# Patient Record
Sex: Male | Born: 1960 | Race: White | Hispanic: No | Marital: Married | Smoking: Never smoker
Health system: Southern US, Community
[De-identification: ages and names within clinical notes are randomized; demographics above are authoritative.]

## PROBLEM LIST (undated history)

## (undated) DIAGNOSIS — Z955 Presence of coronary angioplasty implant and graft: Secondary | ICD-10-CM

## (undated) DIAGNOSIS — M47819 Spondylosis without myelopathy or radiculopathy, site unspecified: Secondary | ICD-10-CM

## (undated) DIAGNOSIS — N528 Other male erectile dysfunction: Secondary | ICD-10-CM

## (undated) DIAGNOSIS — R0781 Pleurodynia: Secondary | ICD-10-CM

## (undated) DIAGNOSIS — E785 Hyperlipidemia, unspecified: Secondary | ICD-10-CM

## (undated) DIAGNOSIS — Z8489 Family history of other specified conditions: Secondary | ICD-10-CM

## (undated) DIAGNOSIS — R161 Splenomegaly, not elsewhere classified: Secondary | ICD-10-CM

## (undated) DIAGNOSIS — E7800 Pure hypercholesterolemia, unspecified: Secondary | ICD-10-CM

## (undated) DIAGNOSIS — I10 Essential (primary) hypertension: Secondary | ICD-10-CM

## (undated) DIAGNOSIS — I200 Unstable angina: Secondary | ICD-10-CM

## (undated) DIAGNOSIS — E119 Type 2 diabetes mellitus without complications: Secondary | ICD-10-CM

## (undated) DIAGNOSIS — K219 Gastro-esophageal reflux disease without esophagitis: Secondary | ICD-10-CM

## (undated) DIAGNOSIS — D696 Thrombocytopenia, unspecified: Secondary | ICD-10-CM

## (undated) HISTORY — DX: Splenomegaly, not elsewhere classified: R16.1

## (undated) HISTORY — PX: CAROTID STENT: SHX1301

## (undated) HISTORY — DX: Hyperlipidemia, unspecified: E78.5

## (undated) HISTORY — PX: VASECTOMY: SHX75

## (undated) HISTORY — DX: Pleurodynia: R07.81

## (undated) HISTORY — DX: Morbid (severe) obesity due to excess calories: E66.01

## (undated) HISTORY — DX: Thrombocytopenia, unspecified: D69.6

## (undated) HISTORY — DX: Unstable angina: I20.0

---

## 2012-08-30 ENCOUNTER — Encounter (HOSPITAL_COMMUNITY): Payer: Self-pay | Admitting: *Deleted

## 2012-08-30 ENCOUNTER — Emergency Department (HOSPITAL_COMMUNITY)
Admission: EM | Admit: 2012-08-30 | Discharge: 2012-08-30 | Disposition: A | Discharge status: Home / Self Care | Payer: BC Managed Care – PPO | Attending: Emergency Medicine | Admitting: Emergency Medicine

## 2012-08-30 ENCOUNTER — Emergency Department (HOSPITAL_COMMUNITY): Payer: BC Managed Care – PPO

## 2012-08-30 DIAGNOSIS — R059 Cough, unspecified: Secondary | ICD-10-CM | POA: Insufficient documentation

## 2012-08-30 DIAGNOSIS — Z7982 Long term (current) use of aspirin: Secondary | ICD-10-CM | POA: Insufficient documentation

## 2012-08-30 DIAGNOSIS — R05 Cough: Secondary | ICD-10-CM | POA: Insufficient documentation

## 2012-08-30 DIAGNOSIS — I10 Essential (primary) hypertension: Secondary | ICD-10-CM | POA: Insufficient documentation

## 2012-08-30 DIAGNOSIS — D696 Thrombocytopenia, unspecified: Secondary | ICD-10-CM

## 2012-08-30 DIAGNOSIS — Z79899 Other long term (current) drug therapy: Secondary | ICD-10-CM | POA: Insufficient documentation

## 2012-08-30 DIAGNOSIS — E7800 Pure hypercholesterolemia, unspecified: Secondary | ICD-10-CM | POA: Insufficient documentation

## 2012-08-30 DIAGNOSIS — E119 Type 2 diabetes mellitus without complications: Secondary | ICD-10-CM | POA: Insufficient documentation

## 2012-08-30 DIAGNOSIS — E669 Obesity, unspecified: Secondary | ICD-10-CM | POA: Insufficient documentation

## 2012-08-30 DIAGNOSIS — Z794 Long term (current) use of insulin: Secondary | ICD-10-CM | POA: Insufficient documentation

## 2012-08-30 DIAGNOSIS — R0789 Other chest pain: Secondary | ICD-10-CM

## 2012-08-30 HISTORY — DX: Type 2 diabetes mellitus without complications: E11.9

## 2012-08-30 HISTORY — DX: Essential (primary) hypertension: I10

## 2012-08-30 HISTORY — DX: Pure hypercholesterolemia, unspecified: E78.00

## 2012-08-30 LAB — COMPREHENSIVE METABOLIC PANEL
ALT: 45 U/L (ref 0–53)
Alkaline Phosphatase: 76 U/L (ref 39–117)
CO2: 23 mEq/L (ref 19–32)
Chloride: 102 mEq/L (ref 96–112)
GFR calc Af Amer: >90 mL/min (ref >90)
Glucose, Bld: 171 mg/dL — ABNORMAL HIGH (ref 70–99)
Potassium: 4 mEq/L (ref 3.5–5.1)
Sodium: 137 mEq/L (ref 135–145)
Total Protein: 6.9 g/dL (ref 6.0–8.3)

## 2012-08-30 LAB — CBC WITH DIFFERENTIAL/PLATELET
Basophils Absolute: 0 10*3/uL (ref 0.0–0.1)
Basophils Relative: 0 % (ref 0–1)
Eosinophils Relative: 1 % (ref 0–5)
HCT: 42.7 % (ref 39.0–52.0)
MCHC: 36.3 g/dL — ABNORMAL HIGH (ref 30.0–36.0)
Monocytes Absolute: 0.5 10*3/uL (ref 0.1–1.0)
Neutro Abs: 5.3 10*3/uL (ref 1.7–7.7)
RDW: 13.1 % (ref 11.5–15.5)

## 2012-08-30 MED ORDER — GI COCKTAIL ~~LOC~~
30.0000 mL | Freq: Once | ORAL | Status: AC
  Administered 2012-08-30: 30 mL via ORAL
  Filled 2012-08-30: qty 30

## 2012-08-30 MED ORDER — CYCLOBENZAPRINE HCL 10 MG PO TABS: 10.0000 mg | ORAL_TABLET | Freq: Two times a day (BID) | ORAL | Status: DC | PRN

## 2012-08-30 MED ORDER — NITROGLYCERIN 0.4 MG SL SUBL
0.4000 mg | SUBLINGUAL_TABLET | SUBLINGUAL | Status: DC | PRN
  Administered 2012-08-30: 0.4 mg via SUBLINGUAL
  Filled 2012-08-30: qty 25

## 2012-08-30 MED ORDER — ASPIRIN 325 MG PO TABS
325.0000 mg | ORAL_TABLET | ORAL | Status: AC
  Administered 2012-08-30: 325 mg via ORAL
  Filled 2012-08-30: qty 1

## 2012-08-30 NOTE — ED Notes (Addendum)
C/o CP & indigestion, onset Saturday evening, constant, fluctuates, movement may help some, also belching, intermittantly sharp, pinpoints to L chest, no relief with gas x or pepcid. Alert, NAD, calm, interactive, resps e/u, speaking in clear complete sentences. Wife at [facility] 2. Pt of Dr. Lenord Carbo FM. Takes novolog, lantus, micardis, fishoil, ASA, simvastatin.

## 2012-08-30 NOTE — ED Notes (Addendum)
Primary RN attempted IV start x2. 2nd RN to attempt.

## 2012-08-30 NOTE — ED Notes (Signed)
PA at bedside.

## 2012-08-30 NOTE — ED Notes (Signed)
2nd RN attempted IV x2. IV team paged.

## 2012-08-30 NOTE — ED Notes (Signed)
Family at bedside. 

## 2012-08-30 NOTE — ED Notes (Signed)
Pt transported to XR.  

## 2012-08-30 NOTE — ED Notes (Signed)
Pt returned from XR, placed back on monitor. 

## 2012-08-30 NOTE — ED Provider Notes (Signed)
History     CSN: [id number]  Arrival date & time 08/30/12  0457   First MD Initiated Contact with Patient 08/30/12 0606      Chief Complaint  Patient presents with  . Chest Pain    (Consider location/radiation/quality/duration/timing/severity/associated sxs/prior treatment) HPI  Maurice Johnston is a 52 y.o. male c/o left lower midaxillary chest pain occasionally radiating to the anterior left side, described as sharp, rated at 6/10 at worst, 1/10 now. No exacerbating or alleviating factors identified. Pain started over 30 hours ago on Saturday night when the patient return to his hotel room on a business trip. Pain has been waxing and waning, non-exertional, non-pleuritic or positional. There are no associated symptoms. Denies SOB, N/V, diaphoresis, cough, fever, back pain, syncope, prior episodes, recent cocaine/methamphetimine use. Denies h/o DVT, PE,  leg swelling, hemoptysis. Patient has been traveling short distances around the state in his car.  Patient takes a daily low dose aspirin in the evening, he had his last night.  RF: Diabetes, hypertension, high cholesterol Cath:  ?  Last Stress test: ? Cardiologost: None PCP: Dr. Lenord Johnston FM   Past Medical History  Diagnosis Date  . Diabetes mellitus without complication   . Hypertension   . Hypercholesterolemia     Past Surgical History  Procedure Laterality Date  . Vasectomy      No family history on file.  History  Substance Use Topics  . Smoking status: Never Smoker   . Smokeless tobacco: Not on file  . Alcohol Use: Yes     Comment: occasional      Review of Systems  Constitutional: Negative for fever.  Respiratory: Negative for shortness of breath.   Cardiovascular: Positive for chest pain.  Gastrointestinal: Negative for nausea, vomiting, abdominal pain and diarrhea.  All other systems reviewed and are negative.    Allergies  Review of patient's allergies indicates no known  allergies.  Home Medications   Current Outpatient Rx  Name  Route  Sig  Dispense  Refill  . aspirin EC 81 MG tablet   Oral   Take 81 mg by mouth daily.         . Famotidine (PEPCID PO)   Oral   Take 1 tablet by mouth every 4 (four) hours as needed (for indigestion).          . insulin glargine (LANTUS) 100 UNIT/ML injection   Subcutaneous   Inject 100 Units into the skin at bedtime.         . insulin glargine (LANTUS) 100 UNIT/ML injection   Subcutaneous   Inject into the skin at bedtime.         . insulin lispro (HUMALOG) 100 UNIT/ML injection   Subcutaneous   Inject 40 Units into the skin 3 (three) times daily before meals.         . insulin lispro (HUMALOG) 100 UNIT/ML injection   Subcutaneous   Inject into the skin 3 (three) times daily as needed for high blood sugar (Patient also takes 1 additional unit for every 100 grams of carbs, along with his regular 40 unit dose).         . naproxen sodium (ANAPROX) 220 MG tablet   Oral   Take 440 mg by mouth every evening. For foot pain         . omega-3 acid ethyl esters (LOVAZA) 1 G capsule   Oral   Take 1 g by mouth 2 (two) times daily.         Marland Kitchen  Simethicone (GAS-X PO)   Oral   Take 1 tablet by mouth every 4 (four) hours as needed (for indigestion).          Marland Kitchen SIMVASTATIN PO   Oral   Take by mouth.         . Tadalafil (CIALIS PO)   Oral   Take 1 tablet by mouth daily as needed.         Marland Kitchen telmisartan (MICARDIS) 40 MG tablet   Oral   Take 40 mg by mouth daily.           BP 138/73  Pulse 75  Temp(Src) 97.9 F (36.6 C) (Oral)  Resp 19  SpO2 98%  Physical Exam  Nursing note and vitals reviewed. Constitutional: He is oriented to person, place, and time. He appears well-developed and well-nourished. No distress.  Obese  HENT:  Head: Normocephalic.  Mouth/Throat: Oropharynx is clear and moist.  Eyes: Conjunctivae and EOM are normal. Pupils are equal, round, and reactive to light.   Neck: Normal range of motion. No JVD present.  Cardiovascular: Normal rate, regular rhythm and intact distal pulses.  Exam reveals no gallop and no friction rub.   No murmur heard. Pulmonary/Chest: Effort normal and breath sounds normal. No stridor. No respiratory distress. He has no wheezes. He has no rales. He exhibits no tenderness.  Abdominal: Soft. Bowel sounds are normal. He exhibits no distension and no mass. There is no tenderness. There is no rebound and no guarding.  Musculoskeletal: Normal range of motion. He exhibits no edema and no tenderness.  No calf asymmetry, superficial collaterals, palpable cords, edema, Homans sign negative bilaterally.    Neurological: He is alert and oriented to person, place, and time.  Psychiatric: He has a normal mood and affect.    ED Course  Procedures (including critical care time)  Labs Reviewed  COMPREHENSIVE METABOLIC PANEL  CBC WITH DIFFERENTIAL   Dg Chest 2 View  08/30/2012   *RADIOLOGY REPORT*  Clinical Data: Chest pain.  CHEST - 2 VIEW  Comparison: No priors.  Findings: Lung volumes are normal.  No consolidative airspace disease.  No pleural effusions.  No pneumothorax.  No pulmonary nodule or mass noted.  Pulmonary vasculature and the cardiomediastinal silhouette are within normal limits.  IMPRESSION: 1. No radiographic evidence of acute cardiopulmonary disease.   Original Report Authenticated By: Trudie Reed, M.D.    Date: 08/30/2012  Rate: 72  Rhythm: normal sinus rhythm and premature ventricular contractions (PVC)  QRS Axis: normal  Intervals: normal  ST/T Wave abnormalities: normal  Conduction Disutrbances:none  Narrative Interpretation:   Old EKG Reviewed: none available    1. Chest wall pain       MDM   Filed Vitals:   08/30/12 0501  BP: 138/73  Pulse: 75  Temp: 97.9 F (36.6 C)  TempSrc: Oral  Resp: 19  SpO2: 98%     Maurice Johnston is a 52 y.o. male with left-sided not midaxillary chest pain for  the last 30+ hours, no associated symptoms. EKG is nonischemic, troponin is negative, chest x-ray shows no abnormality there are Wells score 0, PERC score 0. .ACS, PE. Patient is reliable to outpatient followup, return precautions given  Medications  nitroGLYCERIN (NITROSTAT) SL tablet 0.4 mg (0.4 mg Sublingual Given 08/30/12 0517)  aspirin tablet 325 mg (325 mg Oral Given 08/30/12 0517)  gi cocktail (Maalox,Lidocaine,Donnatal) (30 mLs Oral Given 08/30/12 0657)    The patient is hemodynamically stable, appropriate for, and amenable to, discharge  at this time. Pt verbalized understanding and agrees with care plan. Outpatient follow-up and return precautions given.    New Prescriptions   CYCLOBENZAPRINE (FLEXERIL) 10 MG TABLET    Take 1 tablet (10 mg total) by mouth 2 (two) times daily as needed for muscle spasms.           Wynetta Emery, PA-C 08/31/12 [phone]

## 2012-09-01 ENCOUNTER — Inpatient Hospital Stay (HOSPITAL_COMMUNITY)
Admission: AD | Admit: 2012-09-01 | Discharge: 2012-09-07 | DRG: 550 | Disposition: A | Discharge status: Home / Self Care | Payer: BC Managed Care – PPO | Attending: Interventional Cardiology | Admitting: Interventional Cardiology

## 2012-09-01 ENCOUNTER — Encounter (HOSPITAL_COMMUNITY): Payer: Self-pay | Admitting: *Deleted

## 2012-09-01 DIAGNOSIS — I200 Unstable angina: Secondary | ICD-10-CM

## 2012-09-01 DIAGNOSIS — IMO0001 Reserved for inherently not codable concepts without codable children: Secondary | ICD-10-CM | POA: Diagnosis present

## 2012-09-01 DIAGNOSIS — D696 Thrombocytopenia, unspecified: Secondary | ICD-10-CM | POA: Diagnosis present

## 2012-09-01 DIAGNOSIS — Z955 Presence of coronary angioplasty implant and graft: Secondary | ICD-10-CM

## 2012-09-01 DIAGNOSIS — D6959 Other secondary thrombocytopenia: Secondary | ICD-10-CM | POA: Diagnosis present

## 2012-09-01 DIAGNOSIS — IMO0002 Reserved for concepts with insufficient information to code with codable children: Secondary | ICD-10-CM | POA: Diagnosis not present

## 2012-09-01 DIAGNOSIS — N181 Chronic kidney disease, stage 1: Secondary | ICD-10-CM | POA: Diagnosis present

## 2012-09-01 DIAGNOSIS — N529 Male erectile dysfunction, unspecified: Secondary | ICD-10-CM | POA: Diagnosis present

## 2012-09-01 DIAGNOSIS — Z794 Long term (current) use of insulin: Secondary | ICD-10-CM

## 2012-09-01 DIAGNOSIS — I2510 Atherosclerotic heart disease of native coronary artery without angina pectoris: Principal | ICD-10-CM

## 2012-09-01 DIAGNOSIS — N058 Unspecified nephritic syndrome with other morphologic changes: Secondary | ICD-10-CM | POA: Diagnosis present

## 2012-09-01 DIAGNOSIS — R161 Splenomegaly, not elsewhere classified: Secondary | ICD-10-CM

## 2012-09-01 DIAGNOSIS — F430 Acute stress reaction: Secondary | ICD-10-CM | POA: Diagnosis present

## 2012-09-01 DIAGNOSIS — I129 Hypertensive chronic kidney disease with stage 1 through stage 4 chronic kidney disease, or unspecified chronic kidney disease: Secondary | ICD-10-CM | POA: Diagnosis present

## 2012-09-01 DIAGNOSIS — E1165 Type 2 diabetes mellitus with hyperglycemia: Secondary | ICD-10-CM | POA: Diagnosis present

## 2012-09-01 DIAGNOSIS — Z79899 Other long term (current) drug therapy: Secondary | ICD-10-CM

## 2012-09-01 DIAGNOSIS — I10 Essential (primary) hypertension: Secondary | ICD-10-CM

## 2012-09-01 DIAGNOSIS — E0822 Diabetes mellitus due to underlying condition with diabetic chronic kidney disease: Secondary | ICD-10-CM | POA: Diagnosis present

## 2012-09-01 DIAGNOSIS — E785 Hyperlipidemia, unspecified: Secondary | ICD-10-CM

## 2012-09-01 DIAGNOSIS — E1129 Type 2 diabetes mellitus with other diabetic kidney complication: Secondary | ICD-10-CM | POA: Diagnosis present

## 2012-09-01 DIAGNOSIS — R0781 Pleurodynia: Secondary | ICD-10-CM | POA: Diagnosis not present

## 2012-09-01 DIAGNOSIS — Z7982 Long term (current) use of aspirin: Secondary | ICD-10-CM

## 2012-09-01 DIAGNOSIS — Z6837 Body mass index (BMI) 37.0-37.9, adult: Secondary | ICD-10-CM

## 2012-09-01 DIAGNOSIS — Y838 Other surgical procedures as the cause of abnormal reaction of the patient, or of later complication, without mention of misadventure at the time of the procedure: Secondary | ICD-10-CM | POA: Diagnosis not present

## 2012-09-01 LAB — COMPREHENSIVE METABOLIC PANEL
Alkaline Phosphatase: 66 U/L (ref 39–117)
BUN: 19 mg/dL (ref 6–23)
Chloride: 102 mEq/L (ref 96–112)
Creatinine, Ser: 0.83 mg/dL (ref 0.50–1.35)
GFR calc Af Amer: >90 mL/min (ref >90)
Glucose, Bld: 83 mg/dL (ref 70–99)
Potassium: 3.7 mEq/L (ref 3.5–5.1)
Total Bilirubin: 0.6 mg/dL (ref 0.3–1.2)

## 2012-09-01 LAB — MAGNESIUM: Magnesium: 1.8 mg/dL (ref 1.5–2.5)

## 2012-09-01 LAB — PRO B NATRIURETIC PEPTIDE: Pro B Natriuretic peptide (BNP): 25.1 pg/mL (ref 0–125)

## 2012-09-01 LAB — TROPONIN I: Troponin I: <0.3 ng/mL (ref <0.30)

## 2012-09-01 MED ORDER — ASPIRIN EC 81 MG PO TBEC: 81.0000 mg | DELAYED_RELEASE_TABLET | Freq: Every day | ORAL | Status: DC

## 2012-09-01 MED ORDER — INSULIN ASPART 100 UNIT/ML ~~LOC~~ SOLN: 1.0000 [IU] | Freq: Three times a day (TID) | SUBCUTANEOUS | Status: DC | PRN

## 2012-09-01 MED ORDER — ONDANSETRON HCL 4 MG/2ML IJ SOLN: 4.0000 mg | Freq: Four times a day (QID) | INTRAMUSCULAR | Status: DC | PRN

## 2012-09-01 MED ORDER — IRBESARTAN 150 MG PO TABS
150.0000 mg | ORAL_TABLET | Freq: Every day | ORAL | Status: DC
  Administered 2012-09-01 – 2012-09-07 (×6): 150 mg via ORAL
  Filled 2012-09-01 (×7): qty 1

## 2012-09-01 MED ORDER — NITROGLYCERIN IN D5W 200-5 MCG/ML-% IV SOLN
3.0000 ug/min | INTRAVENOUS | Status: DC
  Administered 2012-09-01: 20 ug/min via INTRAVENOUS
  Administered 2012-09-02: 30 ug/min via INTRAVENOUS
  Administered 2012-09-04: 20 ug/min via INTRAVENOUS
  Administered 2012-09-05: 25 ug/min via INTRAVENOUS
  Filled 2012-09-01 (×4): qty 250

## 2012-09-01 MED ORDER — INSULIN ASPART 100 UNIT/ML ~~LOC~~ SOLN
40.0000 [IU] | Freq: Three times a day (TID) | SUBCUTANEOUS | Status: DC
  Administered 2012-09-01 – 2012-09-02 (×2): 40 [IU] via SUBCUTANEOUS
  Administered 2012-09-02: 13:00:00 via SUBCUTANEOUS
  Administered 2012-09-03 – 2012-09-07 (×11): 40 [IU] via SUBCUTANEOUS
  Filled 2012-09-01 (×21): qty 0.4

## 2012-09-01 MED ORDER — ASPIRIN 81 MG PO CHEW
CHEWABLE_TABLET | ORAL | Status: AC
  Filled 2012-09-01: qty 1

## 2012-09-01 MED ORDER — NITROGLYCERIN 0.4 MG SL SUBL: 0.4000 mg | SUBLINGUAL_TABLET | SUBLINGUAL | Status: DC | PRN

## 2012-09-01 MED ORDER — SIMVASTATIN 40 MG PO TABS
40.0000 mg | ORAL_TABLET | Freq: Every day | ORAL | Status: DC
  Administered 2012-09-01 – 2012-09-06 (×6): 40 mg via ORAL
  Filled 2012-09-01 (×8): qty 1

## 2012-09-01 MED ORDER — INSULIN GLARGINE 100 UNIT/ML ~~LOC~~ SOLN
100.0000 [IU] | Freq: Every day | SUBCUTANEOUS | Status: DC
  Administered 2012-09-01 – 2012-09-06 (×6): 100 [IU] via SUBCUTANEOUS
  Filled 2012-09-01 (×7): qty 1

## 2012-09-01 MED ORDER — FAMOTIDINE 40 MG/5ML PO SUSR
20.0000 mg | Freq: Every day | ORAL | Status: DC
  Administered 2012-09-01 – 2012-09-05 (×5): 20 mg via ORAL
  Filled 2012-09-01 (×8): qty 2.5

## 2012-09-01 MED ORDER — ASPIRIN EC 81 MG PO TBEC
81.0000 mg | DELAYED_RELEASE_TABLET | Freq: Every day | ORAL | Status: DC
  Administered 2012-09-03 – 2012-09-07 (×4): 81 mg via ORAL
  Filled 2012-09-01 (×7): qty 1

## 2012-09-01 MED ORDER — ASPIRIN 81 MG PO CHEW
CHEWABLE_TABLET | ORAL | Status: AC
  Filled 2012-09-01: qty 3

## 2012-09-01 MED ORDER — CYCLOBENZAPRINE HCL 10 MG PO TABS
10.0000 mg | ORAL_TABLET | Freq: Two times a day (BID) | ORAL | Status: DC | PRN
  Administered 2012-09-06: 10 mg via ORAL
  Filled 2012-09-01 (×2): qty 1

## 2012-09-01 MED ORDER — ACETAMINOPHEN 325 MG PO TABS
650.0000 mg | ORAL_TABLET | ORAL | Status: DC | PRN
  Administered 2012-09-02: 650 mg via ORAL
  Filled 2012-09-01: qty 2

## 2012-09-01 MED ORDER — ENOXAPARIN SODIUM 120 MG/0.8ML ~~LOC~~ SOLN
120.0000 mg | Freq: Two times a day (BID) | SUBCUTANEOUS | Status: DC
  Administered 2012-09-01 – 2012-09-02 (×2): 120 mg via SUBCUTANEOUS
  Filled 2012-09-01 (×4): qty 0.8

## 2012-09-01 MED ORDER — METOPROLOL TARTRATE 25 MG PO TABS
25.0000 mg | ORAL_TABLET | Freq: Two times a day (BID) | ORAL | Status: DC
Start: 2012-09-01 — End: 2012-09-07
  Administered 2012-09-01 – 2012-09-06 (×12): 25 mg via ORAL
  Filled 2012-09-01 (×16): qty 1

## 2012-09-01 MED ORDER — ASPIRIN 300 MG RE SUPP
300.0000 mg | RECTAL | Status: AC
  Filled 2012-09-01: qty 1

## 2012-09-01 MED ORDER — ASPIRIN 81 MG PO CHEW
324.0000 mg | CHEWABLE_TABLET | ORAL | Status: AC
  Administered 2012-09-01: 324 mg via ORAL
  Filled 2012-09-01: qty 1

## 2012-09-01 NOTE — Progress Notes (Signed)
Utilization Review Completed. 09/01/2012  

## 2012-09-01 NOTE — Progress Notes (Signed)
ANTICOAGULATION CONSULT NOTE - Initial Consult  Pharmacy Consult for lovenox Indication: chest pain/ACS  No Known Allergies  Patient Measurements: Height: 5\' 10"  (177.8 cm) Weight: 261 lb 11 oz (118.7 kg) IBW/kg (Calculated) : 73  Vital Signs: BP: 150/50 mmHg (06/18 1549) Pulse Rate: 8 (06/18 1549)  Labs:  Recent Labs  08/30/12 0630 09/01/12 1430  HGB 15.5  --   HCT 42.7  --   PLT 148*  --   CREATININE 0.72 0.83  TROPONINI  --  <0.30    Estimated Creatinine Clearance: 139 ml/min (by C-G formula based on Cr of 0.83).   Medical History: Past Medical History  Diagnosis Date  . Diabetes mellitus without complication   . Hypertension   . Hypercholesterolemia     Medications:  Prescriptions prior to admission  Medication Sig Dispense Refill  . aspirin EC 81 MG tablet Take 81 mg by mouth daily.      . cyclobenzaprine (FLEXERIL) 10 MG tablet Take 1 tablet (10 mg total) by mouth 2 (two) times daily as needed for muscle spasms.  20 tablet  0  . insulin glargine (LANTUS) 100 UNIT/ML injection Inject 100 Units into the skin at bedtime.      . insulin glargine (LANTUS) 100 UNIT/ML injection Inject into the skin at bedtime.      . insulin lispro (HUMALOG) 100 UNIT/ML injection Inject 40 Units into the skin 3 (three) times daily before meals.      . naproxen sodium (ANAPROX) 220 MG tablet Take 440 mg by mouth every evening. For foot pain      . Simethicone (GAS-X PO) Take 1 tablet by mouth every 4 (four) hours as needed (for indigestion).       . Tadalafil (CIALIS PO) Take 1 tablet by mouth daily as needed.      Marland Kitchen telmisartan (MICARDIS) 40 MG tablet Take 40 mg by mouth daily.        Assessment: 51 yom with chest pain to start IV heparin for anticoagulation. CBC from today are still pending but H/H was WNL on 6/16 and plts were slightly low. Pt has good renal function and was not on any anticoagulation PTA.   Goal of Therapy:  Anti-Xa level 0.6-1.2 units/ml 4hrs after LMWH  dose given Monitor platelets by anticoagulation protocol: Yes   Plan:  1. Lovenox 120mg  SQ Q12H 2. CBC Q72H while on lovenox  Nalin Mazzocco, Drake Leach 09/01/2012,3:55 PM

## 2012-09-01 NOTE — H&P (Signed)
Patient: Maurice Johnston, Sargeant Account Number: [id number] Provider: Verdis Prime, MD  DOB: [birth year] Age: 52 Y Sex: Male Date: 09/01/2012  Phone: [phone]   Address: [address], [place], [id number]  Pcp: [name]          1. HS/Ref. Dr. Richardine Service pain/ER f/u.        HPI:  General:  72 year old obese gentleman with a 10 year history of diabetes mellitus who began experiencing exertional chest tightness 2 months ago when he worked out. On Saturday (4 days ago) he developed tightness in the chest after eating supper. The discomfort waxed and waned throughout the night. By the next morning he felt better but later in the day the discomfort recurred and became more intense. Late Sunday evening he went to the emergency room at [facility] where he was evaluated at with cardiac markers EKG is and the chest x-ray. No significant abnormalities were found. Starting Monday he has had tightness in the chest that increases with exertion and decreases in intensity and are goes away with rest. He saw Dr. Paulino Rily today who asked that he be evaluated to rule out CAD.  In speaking with the patient in the office, he is having mild residual chest discomfort. He looks nonacute. He characterizes the discomfort as a 1-2/10 intensity. With activity it builds up to 5/10 in intensity.the quality of the discomfort is very similar to that that he has been having with exercise at the gym..        ROS:  CONSTITUTIONAL:  Patient denies chills, fatigue, fever, insomnia, night sweats, and anorexia.  RESPIRATORY:  Patient denies DOE (dyspnea on exertion), cough, blood-tinged sputum, hemoptysis, pain with breathing , wheezing.  GASTROENTEROLOGY:  Patient denies acid reflux, black stools, blood in stool, diarrhea, clay colored stools, dysphagia.  MUSCULOSKELETAL:  Patient denies back pain, carpal tunnel, joint stiffness, joint swelling.  NEUROLOGY:  Patient denies headache, insomnia,  confusion, gait abnormality, paralysis, paresthesias, seizures, transient neurologiacal deficits..  PSYCHOLOGY:  Patient denies anxiety, mania, memory loss, nervousness, nightmares .  ENDOCRINOLOGY:  Patient complains of diabetes mellitusfor greater than 10 years.         Medical History: Hypertension, diabetes mellitus type 2, Dr. Sharl Ma, Elevated cholesterol, CKD Stage I, Fam history Colon Cancer (father at 17, after he came home from [place] war, ?agent orange effect).        Surgical History: vasectomy 1996, Cardiolite Stress - low risk 04/2007.        Hospitalization/Major Diagnostic Procedure: not in past year 09/2011.        Family History: Father: deceased 70 yrs, colon cancer, alcoholismMother: alive 2 yrs, breast cancer, hypertension, heart disease, kidney disease, skin cancer , polyps, diverticulitisPaternal Grand Father: deceased, MIPaternal Grand Mother: deceased, UnknownMaternal [place] Father: deceased, MIMaternal Grand Mother: deceased, CVABrother 1: alive 51 yrs, hypertensionBrother2: alive 84 yrs, hypertensionBrother 3: alive 44 yrs, A + WSister 1: alive 55 yrs, A + W no liver disease.       Social History:  General:  History of smoking  cigarettes: Never smoked no Smoking.  Alcohol: yes, occasionally, beer 2-3 per week, liquor 1-2 times a month.  no Caffeine.  no Recreational drug use.  Exercise: yes, Occasionally1-2 per week, treadmil.  Occupation: employed, Investment banker, operational.  Marital Status: married.  Children: 4, girls.        Medications: Taking Fish Oil 1200 mg Capsule 4 capsules once a day, Taking Aleve 220 MG Tablet 2 tablets as needed for pain, Taking  Aspir-81 81 MG Tablet Delayed Release 1 tablet once a day, Taking Simvastatin 40 MG Tablet 1 tablet every evening Once a day-, Notes: followed by Dr Paulino Rily, Taking Micardis 40 MG Tablet 1 tablet once a day, Taking BD Insulin Syringe 31/5/16x38ml Miscellaneous as directed four times a day (Lantus and Novolog SSI TID),  Taking NovoLog Flexpen 100 UNIT/ML Solution 40 units at meals plus 1 for every 10 over 100 Three times a day (averages 130 units per day), Taking Lantus 120 UNIT/ML Solution 100 units Once a day, Taking Cialis 5 MG Tablet 1 tablet as needed, Taking OneTouch Ultra Test test strips Strip . testing Three-four times a day, Taking Cyclobenzaprine HCl 10 MG Tablet 1 tablet twice a day as needed, Taking BD U/F Short Pen Needle 31G X 8 MM Miscellaneous USE AS DIRECTED three times a day, Taking Nexium 40 MG Capsule Delayed Release 1 tablet once a day, Discontinued Pepcid Complete 800-10-185 MG Tablet Chewable 1 tablet as needed once a day as needed, Medication List reviewed and reconciled with the patient       Allergies: N.K.D.A.          Vitals: Wt 264.8, Wt change -1.2 lb, Ht 70, BMI 37.99, Pulse sitting 76, BP sitting 130/64.       Examination:  Cardiology Exam:  GENERAL APPEARANCE: pleasant, NAD, comfortable, morbidly obese,young, male.  HEENT: normal.  CAROTID UPSTROKE: no bruit, upstrokes intact.  JVD: Unable to evaluate because of neck morphology.  HEART: regular rate and rhythm, normal S1S2, no rub, no gallop, or click.  HEART MURMUR: none.  LUNGS: clear to auscultation, no wheezing/rhonchi/rales.  ABDOMEN: marked abdominal obesity with significant panniculus. No tenderness.  EXTREMITIES: no leg edema.  PERIPHERAL PULSES: 2+, bilateral, In the radials, radials, posterior tibial, popliteal, and femorals. No bruits are heard.Marland Kitchen  NEUROLOGIC: grossly intact, cranial nerves intact, gait WNL.  MOOD: normal.            Assessment:  1. Unstable angina pectoris - 411.1 (Primary)  2. Essential hypertension, benign - 401.1  3. Hyperlipidemia - 272.4  4. Diabetes with renal manifestations, type 2 or unspecified type, uncontrolled - 250.42  5. 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 Obesity (BMI 30-39.9) - 278.00  7. Erectile dysfunction - 607.84        1. Unstable  angina pectoris  Continue Aspir-81 Tablet Delayed Release, 81 MG, 1 tablet, Orally, once a day .  Notes: I requested the patient allow me to admit him to the hospital. We will start low molecular weight heparin and IV nitroglycerin. I have discussed the diagnostic workup which I recommend as coronary angiography to define his anatomy. The procedure and potential risks have been discussed including stroke, death, myocardial infarction, emergency surgery, bleeding, allergy, kidney failure, among others was discussed in detail in the presence of the patient's wife. We will likely proceed with this test within the next 24 hours assuming no laboratory data that would suggest an alternative diagnosis become available.       2. Essential hypertension, benign  Continue Micardis Tablet, 40 MG, 1 tablet, Orally, once a day .       3. Hyperlipidemia  Continue Simvastatin Tablet, 40 MG, 1 tablet every evening, Orally, Once a day-, Notes: followed by Dr Paulino Rily ; Continue Fish Oil Capsule, 1200 mg, 4 capsules, Orally, once a day .       4. Diabetes with renal manifestations, type 2 or unspecified type, uncontrolled  Continue BD  Insulin Syringe Miscellaneous, 31/5/16x2ml, as directed, four times a day (Lantus and Novolog SSI TID) ; Continue NovoLog Flexpen Solution, 100 UNIT/ML, 40 units at meals plus 1 for every 10 over 100, Subcutaneous, Three times a day (averages 130 units per day) ; Continue Lantus Solution, 120 UNIT/ML, 100 units, Subcutaneous, Once a day ; Continue OneTouch Ultra Test Strip, test strips, ., In Vitro, testing Three-four times a day ; Continue BD U/F Short Pen Needle Miscellaneous, 31G X 8 MM, USE AS DIRECTED, subcutaneous, three times a day .       5. Erectile dysfunction  Continue Cialis Tablet, 5 MG, 1 tablet, Orally, as needed .       6. Others  Continue Cyclobenzaprine HCl Tablet, 10 MG, 1 tablet, Orally, twice a day as needed ; Continue Nexium Capsule Delayed Release, 40 MG, 1 tablet,  Orally, once a day ; Continue Pepcid Complete Tablet Chewable, 800-10-185 MG, 1 tablet as needed, Orally, once a day as needed ; Continue Aleve Tablet, 220 MG, 2 tablets, Orally, as needed for pain .        Follow Up: admitted to the hospital (Reason: admitted to the hospital)         Provider: Verdis Prime, MD  Patient: Tejon, Gracie DOB: [birth year] Date: 09/01/2012

## 2012-09-01 NOTE — ED Provider Notes (Signed)
Medical screening examination/treatment/procedure(s) were performed by non-physician practitioner and as supervising physician I was immediately available for consultation/collaboration.  Caulin Begley K Adalynne Steffensmeier-Rasch, MD 09/01/12 2303 

## 2012-09-02 ENCOUNTER — Inpatient Hospital Stay (HOSPITAL_COMMUNITY): Payer: BC Managed Care – PPO

## 2012-09-02 ENCOUNTER — Encounter (HOSPITAL_COMMUNITY)
Admission: AD | Disposition: A | Discharge status: Home / Self Care | Payer: Self-pay | Source: Home / Self Care | Attending: Interventional Cardiology

## 2012-09-02 ENCOUNTER — Encounter (HOSPITAL_COMMUNITY): Payer: Self-pay | Admitting: Radiology

## 2012-09-02 ENCOUNTER — Other Ambulatory Visit: Payer: Self-pay

## 2012-09-02 DIAGNOSIS — I2510 Atherosclerotic heart disease of native coronary artery without angina pectoris: Secondary | ICD-10-CM

## 2012-09-02 DIAGNOSIS — R0781 Pleurodynia: Secondary | ICD-10-CM | POA: Diagnosis not present

## 2012-09-02 DIAGNOSIS — Z01810 Encounter for preprocedural cardiovascular examination: Secondary | ICD-10-CM

## 2012-09-02 HISTORY — PX: LEFT HEART CATHETERIZATION WITH CORONARY ANGIOGRAM: SHX5451

## 2012-09-02 LAB — CBC
HCT: 41.5 % (ref 39.0–52.0)
Hemoglobin: 14.8 g/dL (ref 13.0–17.0)
MCH: 29.4 pg (ref 26.0–34.0)
MCV: 82.5 fL (ref 78.0–100.0)
RBC: 5.03 MIL/uL (ref 4.22–5.81)

## 2012-09-02 LAB — LIPID PANEL: Cholesterol: 140 mg/dL (ref 0–200)

## 2012-09-02 SURGERY — LEFT HEART CATHETERIZATION WITH CORONARY ANGIOGRAM
Anesthesia: LOCAL

## 2012-09-02 MED ORDER — SODIUM CHLORIDE 0.9 % IJ SOLN: 3.0000 mL | INTRAMUSCULAR | Status: DC | PRN

## 2012-09-02 MED ORDER — VERAPAMIL HCL 2.5 MG/ML IV SOLN
INTRAVENOUS | Status: AC
  Filled 2012-09-02: qty 2

## 2012-09-02 MED ORDER — LIDOCAINE HCL (PF) 1 % IJ SOLN
INTRAMUSCULAR | Status: AC
  Filled 2012-09-02: qty 30

## 2012-09-02 MED ORDER — NITROGLYCERIN 0.2 MG/ML ON CALL CATH LAB
INTRAVENOUS | Status: AC
  Filled 2012-09-02: qty 1

## 2012-09-02 MED ORDER — MIDAZOLAM HCL 2 MG/2ML IJ SOLN
INTRAMUSCULAR | Status: AC
  Filled 2012-09-02: qty 2

## 2012-09-02 MED ORDER — SODIUM CHLORIDE 0.9 % IV SOLN: INTRAVENOUS | Status: DC

## 2012-09-02 MED ORDER — ASPIRIN 81 MG PO CHEW: 324.0000 mg | CHEWABLE_TABLET | ORAL | Status: DC

## 2012-09-02 MED ORDER — ENOXAPARIN SODIUM 120 MG/0.8ML ~~LOC~~ SOLN
120.0000 mg | Freq: Two times a day (BID) | SUBCUTANEOUS | Status: DC
  Administered 2012-09-02 – 2012-09-05 (×7): 120 mg via SUBCUTANEOUS
  Filled 2012-09-02 (×10): qty 0.8

## 2012-09-02 MED ORDER — SODIUM CHLORIDE 0.9 % IV SOLN: 250.0000 mL | INTRAVENOUS | Status: DC | PRN

## 2012-09-02 MED ORDER — SODIUM CHLORIDE 0.9 % IJ SOLN: 3.0000 mL | Freq: Two times a day (BID) | INTRAMUSCULAR | Status: DC

## 2012-09-02 MED ORDER — FENTANYL CITRATE 0.05 MG/ML IJ SOLN
INTRAMUSCULAR | Status: AC
  Filled 2012-09-02: qty 2

## 2012-09-02 MED ORDER — HEPARIN (PORCINE) IN NACL 2-0.9 UNIT/ML-% IJ SOLN
INTRAMUSCULAR | Status: AC
  Filled 2012-09-02: qty 1000

## 2012-09-02 MED ORDER — ACETAMINOPHEN 325 MG PO TABS: 650.0000 mg | ORAL_TABLET | ORAL | Status: DC | PRN

## 2012-09-02 MED ORDER — OXYCODONE-ACETAMINOPHEN 5-325 MG PO TABS
1.0000 | ORAL_TABLET | ORAL | Status: DC | PRN
  Administered 2012-09-02: 1 via ORAL
  Filled 2012-09-02: qty 1

## 2012-09-02 MED ORDER — ASPIRIN 81 MG PO CHEW
324.0000 mg | CHEWABLE_TABLET | ORAL | Status: AC
  Administered 2012-09-02: 324 mg via ORAL
  Filled 2012-09-02: qty 4

## 2012-09-02 MED ORDER — INSULIN ASPART 100 UNIT/ML ~~LOC~~ SOLN
0.0000 [IU] | Freq: Three times a day (TID) | SUBCUTANEOUS | Status: DC
  Administered 2012-09-03 (×2): 2 [IU] via SUBCUTANEOUS
  Administered 2012-09-04: 3 [IU] via SUBCUTANEOUS
  Administered 2012-09-04 – 2012-09-06 (×5): 2 [IU] via SUBCUTANEOUS
  Administered 2012-09-06: 18:00:00 3 [IU] via SUBCUTANEOUS
  Administered 2012-09-07: 09:00:00 2 [IU] via SUBCUTANEOUS

## 2012-09-02 MED ORDER — SODIUM CHLORIDE 0.9 % IV SOLN
INTRAVENOUS | Status: DC
  Administered 2012-09-02: 100 mL/h via INTRAVENOUS
  Administered 2012-09-02: 23:00:00 via INTRAVENOUS

## 2012-09-02 MED ORDER — ONDANSETRON HCL 4 MG/2ML IJ SOLN: 4.0000 mg | Freq: Four times a day (QID) | INTRAMUSCULAR | Status: DC | PRN

## 2012-09-02 MED ORDER — IOHEXOL 300 MG/ML  SOLN
25.0000 mL | INTRAMUSCULAR | Status: AC
  Administered 2012-09-02 (×2): 25 mL via ORAL

## 2012-09-02 MED ORDER — INSULIN ASPART 100 UNIT/ML ~~LOC~~ SOLN: 0.0000 [IU] | SUBCUTANEOUS | Status: DC

## 2012-09-02 NOTE — Progress Notes (Signed)
  Echocardiogram 2D Echocardiogram has been performed.  Maurice Johnston [name] 09/02/2012, 6:10 PM

## 2012-09-02 NOTE — Progress Notes (Signed)
ANTICOAGULATION CONSULT NOTE - Follow Up Consult  Pharmacy Consult for lovenox Indication: chest pain/ACS  No Known Allergies  Patient Measurements: Height: 5\' 10"  (177.8 cm) Weight: 263 lb 0.1 oz (119.3 kg) IBW/kg (Calculated) : 73  Vital Signs: Temp: 98 F (36.7 C) (06/19 0800) Temp src: Oral (06/19 0800) BP: 112/46 mmHg (06/19 1140) Pulse Rate: 77 (06/19 1140)  Labs:  Recent Labs  09/01/12 1430 09/01/12 2000 09/02/12 0200  HGB  --   --  14.8  HCT  --   --  41.5  PLT  --   --  141*  CREATININE 0.83  --   --   TROPONINI <0.30 <0.30 <0.30    Estimated Creatinine Clearance: 139.3 ml/min (by C-G formula based on Cr of 0.83).   Medical History: Past Medical History  Diagnosis Date  . Diabetes mellitus without complication   . Hypertension   . Hypercholesterolemia     Medications:  Prescriptions prior to admission  Medication Sig Dispense Refill  . aspirin EC 81 MG tablet Take 81 mg by mouth daily.      . cyclobenzaprine (FLEXERIL) 10 MG tablet Take 1 tablet (10 mg total) by mouth 2 (two) times daily as needed for muscle spasms.  20 tablet  0  . esomeprazole (NEXIUM) 40 MG packet Take 40 mg by mouth at bedtime and may repeat dose one time if needed.      . insulin glargine (LANTUS) 100 UNIT/ML injection Inject 100 Units into the skin at bedtime.      . insulin lispro (HUMALOG) 100 UNIT/ML injection Inject 40 Units into the skin 3 (three) times daily before meals.      . insulin lispro (HUMALOG) 100 UNIT/ML injection Inject 1-20 Units into the skin See admin instructions. Sliding scale insulin tid with meals. Takes one unit for every 10 above 100 cbg.      . naproxen sodium (ANAPROX) 220 MG tablet Take 440 mg by mouth every evening. For foot pain      . omega-3 acid ethyl esters (LOVAZA) 1 G capsule Take 4 g by mouth at bedtime and may repeat dose one time if needed.      . Simethicone (GAS-X PO) Take 1 tablet by mouth every 4 (four) hours as needed (for  indigestion).       . simvastatin (ZOCOR) 40 MG tablet Take 40 mg by mouth daily.      . Tadalafil (CIALIS PO) Take 1 tablet by mouth daily as needed (erectile dysfunction).      Marland Kitchen telmisartan (MICARDIS) 40 MG tablet Take 40 mg by mouth daily.        Assessment: Maurice Johnston admitted 09/01/2012  with chest pain. Pharmacy consulted to restart Lovenox after diagnostic catheterization pending further r/o of PE.    PMH: DM, HTN, hyperlipidemia   Coag:  R/o PE, resume lovenox 120 mg sq q12h, CrCl > 120, CBC wnl CV: CAD, HTN, hyperlipidemia: ASA, irbesartan, metoprolol, simva: SBP < goal, HR wnl Endo: DM Novolog 40 tid, Lantus; glucose < 200  Goal of Therapy:  Anti-Xa level 0.6-1.2 units/ml 4hrs after LMWH dose given Monitor platelets by anticoagulation protocol: Yes   Plan:  1. Lovenox 120mg  SQ Q12H 2. CBC Q72H while on lovenox 3. Follow up PE workup and plans for long running anticoagulation.   Thank you for allowing pharmacy to be a part of this patients care team.  Lovenia Kim Pharm.D., BCPS Clinical Pharmacist 09/02/2012 11:57 AM Pager: [phone] Phone: [phone]

## 2012-09-02 NOTE — Consult Note (Signed)
[address]       [place] [address]             [phone]        Maurice Johnston [facility] Medical Record #[id number] Date of Birth: Dec 21, 1960  Referring: Dr Verdis Prime Primary Care: Emeterio Reeve, MD  Chief Complaint:   Chest Pain   History of Present Illness:    The patient is a 52 year old gentleman who referred to cardiothoracic surgery for consultation due coronary artery disease and input of decision to appropriateness of  CABG vs Stenting. He has a history of type 2 diabetes for approximately 10 years. He began to develop chest tightness approximately 2 months ago when he worked out. Most recently he developed chest pain approximately 5 days ago while eating. It waxed and waned during the evening but when he woke the following day it recurred. He presented to [facility] where EKG cardiac markers were negative. He continues to have intermittent chest pain and cardiology consultation was obtained. He underwent cardiac catheterization on today's date which revealed severe three-vessel coronary artery disease. We are asked to see him in consideration for surgical revascularization versus PCI of the culprit ( circumflex) coronary artery. He did have some abdominal pain prior to the procedure and the plan currently is to obtain a CT scan of his abdomen to evaluate for possible causes. Currently he is not having any pain and appears quite comfortable.  The following cardiac catheterization report is noted:   Diagnostic Cardiac Catheterization Report  Maurice Johnston  52 y.o.  male  08-29-1960  Procedure Date: 09/02/2012  Referring Physician: [name], MD  Primary Cardiologist:: [name], [name], MD  PROCEDURE: Left heart catheterization with selective coronary angiography, left ventriculogram.  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 The patient verbalized understanding and wanted to proceed. Informed written consent was obtained.  PROCEDURE TECHNIQUE: After Xylocaine anesthesia a 5 French Slender sheath was placed in the right radial artery with a single anterior needle wall stick. Coronary angiography was done using a 5 [place] A2 MP and 5 [place] JR 4 catheters. Left ventriculography was done using hand injection via the JR 4 catheter.  Immediately upon lying on the table for the procedure the patient began complaining of severe left lateral subcostal pain with a pleuritic component. He required 3 mg of Versed and 100 mcg of fentanyl to get comfortable enough for [place] to perform the procedure. If we've chosen to proceed with PCI, the procedure would've been difficult to complete because the patient could not remain still on the table.  CONTRAST: Total of 100 cc.  COMPLICATIONS: None.  HEMODYNAMICS: Aortic pressure was 155/72 mmHg; LV pressure was 159/13 mmHg; LVEDP 16 mm mercury. There was no gradient between the left ventricle and aorta.  ANGIOGRAPHIC DATA: The left main coronary artery is widely patent.  The left anterior descending artery is heavily calcified proximally. Luminal irregularities with up to 30% narrowing is noted. The first diagonal is small to moderate in size and 99% (not graftable) the second diagonal arising from the mid LAD is moderate to large in size or diagonal and contains 80-90% ostial narrowing beyond the second diagonal the LAD becomes diffusely diseased with up to 70-80% stenosis and a segmental region that overlaps the third diagonal. The diagonal is small. The LAD  beyond the third diagonal is diffusely diseased although no high-grade focal lesion until the apical LAD where 90% stenosis is noted there is heard minimal transapical LAD distribution..  The left circumflex artery is the culprit vessel. The entire mid vessel contains severe disease with tandem  stenoses of 70%, 99%, and 70% before the origin of the second and third obtuse marginal branches. This entire segment could be treated with a long drug-eluting stent and were probably significantly improve anginal symptoms.  The right coronary artery is dominant. Moderate irregularities are noted in the proximal and mid segment. A large PDA is 95% obstructed. The bifurcation of the distal right coronary into the continuation of the RCA beyond the PDA contains 70% stenosis (forming a Med[place] 0, 1, 1 bifurcation). The PDA/RCA continuation could be treated with PCI although bifurcation PCI is associated with a higher risk of ischemic complications and restenosis.  LEFT VENTRICULOGRAM: Left ventricular angiogram was done in the 30 RAO projection and revealed normal left ventricular wall motion and systolic function with an estimated ejection fraction of 60 %.  IMPRESSIONS: 1. Class IV angina despite IV nitroglycerin due to high-grade obstruction in the circumflex. The right coronary. LAD, and diagonals contain significant diffuse diabetic disease .  2. Normal left ventricular function  3. Severe left lateral subcostal pain with a pleuritic component. The mother has a hereditary clotting abnormality. We should probably consider musculoskeletal causes for this pain versus pulmonary infarction.  RECOMMENDATION: 1. Surgical versus culprit PCI would be the discussion concerning management of the patient's coronary disease.  2. Continue Lovenox and consider CT angiography chest to rule out pulmonary infarction/emboli versus other sources of the severe pain in the left flank/subcostal region  3. We'll obtain a TCTS consult.        Current Activity/ Functional Status: Patient is independent with mobility/ambulation, transfers, ADL's, IADL's.   Zubrod Score: At the time of surgery this patient's most appropriate activity status/level should be described as: []  Normal activity, no symptoms [x]  Symptoms, fully  ambulatory []  Symptoms, in bed less than or equal to 50% of the time []  Symptoms, in bed greater than 50% of the time but less than 100% []  Bedridden []  Moribund  Past Medical History  Diagnosis Date  . Diabetes mellitus without complication   . Hypertension   . Hypercholesterolemia     Past Surgical History  Procedure Laterality Date  . Vasectomy      History  Smoking status  . Never Smoker   Smokeless tobacco  . Not on file    History  Alcohol Use  . Yes    Comment: occasional    History   Social History  . Marital Status: Married    Spouse Name: N/A    Number of Children: N/A  . Years of Education: N/A   Occupational History  . Works as Investment banker, operational   Social History Main Topics  . Smoking status: Never Smoker   . Smokeless tobacco: Not on file  . Alcohol Use: Yes     Comment: occasional  . Drug Use: No  . Sexually Active: Not on file   Family history: Positive for multiple uncles with premature coronary artery disease. One uncle died at age 51 myocardial infarction. Patient's mother notes that patient's brothers have history of factor IX deficiency and the patient has no history of the deficiency. No Known Allergies  Current Facility-Administered Medications  Medication Dose Route Frequency Provider Last Rate Last Dose  . 0.9 %  sodium chloride  infusion   Intravenous Continuous Lyn Records [name], MD 100 mL/hr at 09/02/12 1143 100 mL/hr at 09/02/12 1143  . acetaminophen (TYLENOL) tablet 650 mg  650 mg Oral Q4H PRN Lyn Records [name], MD      . aspirin EC tablet 81 mg  81 mg Oral Daily Lyn Records [name], MD      . cyclobenzaprine (FLEXERIL) tablet 10 mg  10 mg Oral BID PRN Lyn Records [name], MD      . enoxaparin (LOVENOX) injection 120 mg  120 mg Subcutaneous Q12H Lyn Records [name], MD      . famotidine (PEPCID) 40 MG/5ML suspension 20 mg  20 mg Oral Daily Lyn Records [name], MD   20 mg at 09/02/12 1323  . insulin aspart (novoLOG) injection 1 Units  1 Units  Subcutaneous TID PRN Lyn Records [name], MD      . insulin aspart (novoLOG) injection 40 Units  40 Units Subcutaneous TID [facility] Lyn Records [name], MD      . insulin glargine (LANTUS) injection 100 Units  100 Units Subcutaneous QHS Lesleigh Noe, MD   100 Units at 09/01/12 2158  . irbesartan (AVAPRO) tablet 150 mg  150 mg Oral Daily Lyn Records [name], MD   150 mg at 09/02/12 1323  . metoprolol tartrate (LOPRESSOR) tablet 25 mg  25 mg Oral BID Lyn Records [name], MD   25 mg at 09/02/12 1323  . nitroGLYCERIN (NITROSTAT) SL tablet 0.4 mg  0.4 mg Sublingual Q5 Min x 3 PRN Lyn Records [name], MD      . nitroGLYCERIN 0.2 mg/mL in dextrose 5 % infusion  3-30 mcg/min Intravenous Titrated Lyn Records [name], MD 9 mL/hr at 09/02/12 1143 30 mcg/min at 09/02/12 1143  . ondansetron (ZOFRAN) injection 4 mg  4 mg Intravenous Q6H PRN Lyn Records [name], MD      . oxyCODONE-acetaminophen (PERCOCET/ROXICET) 5-325 MG per tablet 1-2 tablet  1-2 tablet Oral Q4H PRN Lyn Records [name], MD      . simvastatin (ZOCOR) tablet 40 mg  40 mg Oral q1800 Lyn Records [name], MD   40 mg at 09/01/12 1729    Prescriptions prior to admission  Medication Sig Dispense Refill  . aspirin EC 81 MG tablet Take 81 mg by mouth daily.      . cyclobenzaprine (FLEXERIL) 10 MG tablet Take 1 tablet (10 mg total) by mouth 2 (two) times daily as needed for muscle spasms.  20 tablet  0  . esomeprazole (NEXIUM) 40 MG packet Take 40 mg by mouth at bedtime and may repeat dose one time if needed.      . insulin glargine (LANTUS) 100 UNIT/ML injection Inject 100 Units into the skin at bedtime.      . insulin lispro (HUMALOG) 100 UNIT/ML injection Inject 40 Units into the skin 3 (three) times daily before meals.      . insulin lispro (HUMALOG) 100 UNIT/ML injection Inject 1-20 Units into the skin See admin instructions. Sliding scale insulin tid with meals. Takes one unit for every 10 above 100 cbg.      . naproxen sodium (ANAPROX) 220 MG tablet Take 440 mg by  mouth every evening. For foot pain      . omega-3 acid ethyl esters (LOVAZA) 1 G capsule Take 4 g by mouth at bedtime and may repeat dose one time if needed.      . Simethicone (GAS-X  PO) Take 1 tablet by mouth every 4 (four) hours as needed (for indigestion).       . simvastatin (ZOCOR) 40 MG tablet Take 40 mg by mouth daily.      . Tadalafil (CIALIS PO) Take 1 tablet by mouth daily as needed (erectile dysfunction).      Marland Kitchen telmisartan (MICARDIS) 40 MG tablet Take 40 mg by mouth daily.        History reviewed. No pertinent family history.   Review of Systems:     Cardiac Review of Systems: Y or N  Chest Pain [ y   ]  Resting SOB [ n  ] Exertional SOB  Maurice Johnston  ]  Orthopnea [?  ]   Pedal Edema [n   ]    Palpitations [n  ] Syncope  Maurice Johnston  ]   Presyncope Maurice Johnston   ]  General Review of Systems: [Y] = yes [  ]=no Constitional: recent weight change [n  ]; anorexia Maurice Johnston  ]; fatigue [ y ]; nausea [n  ]; night sweats [ y ]; fever [n  ]; or chills [n  ]                                                               Dental: poor dentition[n  ]; Last Dentist visit:   Eye : blurred vision Maurice Johnston  ]; diplopia [  n ]; vision changes Maurice Johnston  ];  Amaurosis fugax[ n ]; Resp: cough [n  ];  wheezing[n  ];  hemoptysis[n  ]; shortness of breath[y  ]; paroxysmal nocturnal dyspnea[n  ]; dyspnea on exertion[y  ]; or orthopnea[ ? ];  GI:  gallstones[ n ], vomitingn[  ];  dysphagia[nn  ]; melena[  ];  hematochezia [n  ]; heartburn[ y ];   Hx of  Colonoscopy[ y ];small polyp- benign GU: kidney stones [  n]; hematuria[n  ];   dysuria [ n ];  nocturia[ n ];  history of     obstruction [n  ]; urinary frequency Maurice Johnston  ]             Skin: rash, swelling[n  ];, hair loss[n  ];  peripheral edeman[n  ];  or itching[ n ]; Musculosketetal: myalgias[n  ];  joint swelling[n  ];  joint erythema[  ];  joint pain[ n ];  back pain[y  ];plantar fasciitis  Heme/Lymph: bruising[n  ];  bleeding[ n ];  anemia[n  ];  Neuro: TIA[n  ];  headaches[n  ];  stroke[ n ];   vertigo[y  ];  seizures[n  ];   paresthesias[y  ];  difficulty walking[ n ];  Psych:depression[ n]; anxiety[n  ];  Endocrine: diabetes[ y ];  thyroid dysfunction[n  ];  Immunizations: Flu [  ]; Pneumococcal[  ];  Other:  Physical Exam: BP 112/46  Pulse 77  Temp(Src) 98 F (36.7 C) (Oral)  Resp 15  Ht 5\' 10"  (1.778 m)  Wt 263 lb 0.1 oz (119.3 kg)  BMI 37.74 kg/m2  SpO2 93%  General appearance: alert, cooperative and no distress Neurologic: intact Heart: regular rate and rhythm, S1, S2 normal, no murmur, click, rub or gallop Lungs: clear to auscultation bilaterally Abdomen: soft, non-tender; bowel sounds normal; no masses,  no organomegaly Extremities: extremities normal, atraumatic,  no cyanosis or edema and Peripheral pulses intact, no varicose veins GU/rectal: Deferred Skin: No rashes HEENT: Perl, EOMI, sclerae anicteric pharynx clear of exudates or erythema, teeth in good repair grossly  Diagnostic Studies & Laboratory data:     Recent Radiology Findings:   No results found.    Recent Lab Findings: Lab Results  Component Value Date   WBC 8.3 09/02/2012   HGB 14.8 09/02/2012   HCT 41.5 09/02/2012   PLT 141* 09/02/2012   GLUCOSE 83 09/01/2012   CHOL 140 09/02/2012   TRIG 350* 09/02/2012   HDL 30* 09/02/2012   LDLCALC 40 09/02/2012   ALT 49 09/01/2012   AST 31 09/01/2012   NA 137 09/01/2012   K 3.7 09/01/2012   CL 102 09/01/2012   CREATININE 0.83 09/01/2012   BUN 19 09/01/2012   CO2 23 09/01/2012   TSH 1.615 09/01/2012   HGBA1C 7.6* 09/01/2012      Assessment / Plan:    The clinical situation is difficult to the patient's diffuse coronary artery disease especially in the distal LAD, his current anatomy is not ideal for coronary artery bypass grafting, especially placing a mammary to the LAD, as most of the LAD disease is very distal. The case has been discussed with Dr. Katrinka Blazing, in spite of the patient's diabetes his anatomy is not make it unreasonable consider culprit  angioplasty of the circumflex.   The patient's abdominal pain/flank pain on the left at the time of catheterization is somewhat puzzling an unexplained. I agree with Dr. Katrinka Blazing CT scan is indicated to rule out possible pulmonary or abdominal pathology. After this is done we'll further discuss with the patient the treatment options and make a final decision. I have discussed with the patient the process of coronary artery bypass grafting and the risks involved.

## 2012-09-02 NOTE — CV Procedure (Signed)
Diagnostic Cardiac Catheterization Report  Maurice Johnston  52 y.o.  male [birth year]  Procedure Date: 09/02/2012 Referring Physician: [name], MD Primary Cardiologist:: [name], [name], MD   PROCEDURE:  Left heart catheterization with selective coronary angiography, left ventriculogram.  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  The patient verbalized understanding and wanted to proceed.  Informed written consent was obtained.  PROCEDURE TECHNIQUE:  After Xylocaine anesthesia a 5 French Slender sheath was placed in the right radial artery with a single anterior needle wall stick.   Coronary angiography was done using a 5 [place] A2 MP and 5 [place] JR 4 catheters.  Left ventriculography was done using hand injection via the JR 4 catheter.   Immediately upon lying on the table for the procedure the patient began complaining of severe left lateral subcostal pain with a pleuritic component. He required 3 mg of Versed and 100 mcg of fentanyl to get comfortable enough for [place] to perform the procedure. If we've chosen to proceed with PCI, the procedure would've been difficult to complete because the patient could not remain still on the table.   CONTRAST:  Total of 100 cc.  COMPLICATIONS:  None.    HEMODYNAMICS:  Aortic pressure was 155/72 mmHg; LV pressure was 159/13 mmHg; LVEDP 16 mm mercury.  There was no gradient between the left ventricle and aorta.    ANGIOGRAPHIC DATA:   The left main coronary artery is widely patent.  The left anterior descending artery is heavily calcified proximally. Luminal irregularities with up to 30% narrowing is noted. The first diagonal is small to moderate in size and 99% (not graftable) the second diagonal arising from the mid LAD is moderate to large in size or diagonal and contains 80-90%  ostial narrowing beyond the second diagonal the LAD becomes diffusely diseased with up to 70-80% stenosis and a segmental region that overlaps the third diagonal. The diagonal is small. The LAD beyond the third diagonal is diffusely diseased although no high-grade focal lesion until the apical LAD where 90% stenosis is noted there is heard minimal transapical LAD distribution..  The left circumflex artery is the culprit vessel. The entire mid vessel contains severe disease with tandem stenoses of 70%, 99%, and 70% before the origin of the second and third obtuse marginal branches. This entire segment could be treated with a long drug-eluting stent and were probably significantly improve anginal symptoms.  The right coronary artery is dominant. Moderate irregularities are noted in the proximal and mid segment. A large PDA is 95% obstructed. The bifurcation of the distal right coronary into the continuation of the RCA beyond the PDA contains 70% stenosis (forming a [place] 0, 1, 1 bifurcation). The PDA/RCA continuation could be treated with PCI although bifurcation PCI is associated with a higher risk of ischemic complications and restenosis.  LEFT VENTRICULOGRAM:  Left ventricular angiogram was done in the 30 RAO projection and revealed normal left ventricular wall motion and systolic function with an estimated ejection fraction of 60 %.    IMPRESSIONS:  1. Class IV angina despite IV nitroglycerin due to high-grade obstruction in the circumflex. The right coronary. LAD, and diagonals contain significant diffuse diabetic disease .  2. Normal left ventricular function  3. Severe left lateral subcostal pain with a pleuritic component. The mother has a hereditary clotting abnormality. We should probably  consider musculoskeletal causes for this pain versus pulmonary infarction.   RECOMMENDATION:  1. Surgical versus culprit PCI would be the discussion concerning management of the patient's coronary  disease.  2. Continue Lovenox and consider CT angiography chest to rule out pulmonary infarction/emboli versus other sources of the severe pain in the left flank/subcostal region  3. We'll obtain a TCTS consult.

## 2012-09-02 NOTE — H&P (Signed)
The patient is 52 years of age and has near continuous chest discomfort of anginal quality. Atypical feature is the absence of EKG or cardiac markers to suggest acute coronary syndrome. Activity aggravates the discomfort. He has multiple significant risk factors including obesity, poorly controlled diabetes, hyperlipidemia, hypertension, and stress.  He has had several office and emergency room visits because of the chest discomfort with no explanation found. He is undergone coronary angiography to define anatomy and help guide therapy. I think is highly likely to have critical coronary disease.Cath Lab Visit (complete for each Cath Lab visit)  Clinical Evaluation Leading to the Procedure:   ACS: yes  Non-ACS:    Anginal Classification: CCS IV  Anti-ischemic medical therapy: Maximal Therapy (2 or more classes of medications)  Non-Invasive Test Results: No non-invasive testing performed  Prior CABG: No previous CABG

## 2012-09-02 NOTE — Progress Notes (Signed)
Still having significant left subcostal and lower chest severe pleuritic pain that is not angina. This pain became severe this AM when he got on the cath table. R/O pulmonary infarction vs subdiaphragmatic process vs musculaoskeletal. No pericardial effusion on echo.

## 2012-09-03 ENCOUNTER — Encounter (HOSPITAL_COMMUNITY): Payer: Self-pay | Admitting: Radiology

## 2012-09-03 ENCOUNTER — Encounter (HOSPITAL_COMMUNITY): Payer: BC Managed Care – PPO

## 2012-09-03 ENCOUNTER — Inpatient Hospital Stay (HOSPITAL_COMMUNITY): Payer: BC Managed Care – PPO

## 2012-09-03 DIAGNOSIS — R161 Splenomegaly, not elsewhere classified: Secondary | ICD-10-CM

## 2012-09-03 DIAGNOSIS — D696 Thrombocytopenia, unspecified: Secondary | ICD-10-CM

## 2012-09-03 LAB — GLUCOSE, CAPILLARY
Glucose-Capillary: 96 mg/dL (ref 70–99)
Glucose-Capillary: 139 mg/dL — ABNORMAL HIGH (ref 70–99)
Glucose-Capillary: 137 mg/dL — ABNORMAL HIGH (ref 70–99)
Glucose-Capillary: 95 mg/dL (ref 70–99)

## 2012-09-03 LAB — DIC (DISSEMINATED INTRAVASCULAR COAGULATION)PANEL
INR: 1.01 (ref 0.00–1.49)
Prothrombin Time: 13.2 seconds (ref 11.6–15.2)

## 2012-09-03 MED ORDER — IOHEXOL 350 MG/ML SOLN
100.0000 mL | Freq: Once | INTRAVENOUS | Status: AC | PRN
  Administered 2012-09-03: 100 mL via INTRAVENOUS

## 2012-09-03 NOTE — Progress Notes (Signed)
Patient Name: Maurice Johnston Date of Encounter: 09/03/2012    SUBJECTIVE: the patient has had no chest discomfort overnight. He is sitting up in a chair because when he lays down he just left upper quadrant pain. The pain is noncardiac. He has had no chest tightness since admission. CT scan of the chest and abdomen last evening revealed splenomegaly. No other significant findings in the left upper quadrant left lower chest area were found.  TELEMETRY:  Normal sinus rhythm: Filed Vitals:   09/03/12 0600 09/03/12 0700 09/03/12 0739 09/03/12 0800  BP: 126/66  132/72   Pulse:   76 76  Temp:   98 F (36.7 C)   TempSrc:   Oral   Resp: 16 18 18    Height:      Weight:      SpO2:   96% 98%    Intake/Output Summary (Last 24 hours) at 09/03/12 0945 Last data filed at 09/03/12 0800  Gross per 24 hour  Intake   2998 ml  Output   2300 ml  Net    698 ml    LABS: Basic Metabolic Panel:  Recent Labs  40/98/11 1430  NA 137  K 3.7  CL 102  CO2 23  GLUCOSE 83  BUN 19  CREATININE 0.83  CALCIUM 9.3  MG 1.8   CBC:  Recent Labs  09/02/12 0200  WBC 8.3  HGB 14.8  HCT 41.5  MCV 82.5  PLT 141*   Cardiac Enzymes:  Recent Labs  09/01/12 1430 09/01/12 2000 09/02/12 0200  TROPONINI <0.30 <0.30 <0.30   BNP: No components found with this basename: POCBNP,  Hemoglobin A1C:  Recent Labs  09/01/12 1430  HGBA1C 7.6*   Fasting Lipid Panel:  Recent Labs  09/02/12 0200  CHOL 140  HDL 30*  LDLCALC 40  TRIG 914*  CHOLHDL 4.7    Radiology/Studies:  Splenomegaly by CT angio of the abdomen  Physical Exam: Blood pressure 132/72, pulse 76, temperature 98 F (36.7 C), temperature source Oral, resp. rate 18, height 5\' 10"  (1.778 m), weight 120.7 kg (266 lb 1.5 oz), SpO2 98.00%. Weight change: 2 kg (4 lb 6.5 oz)   Chest is clear to auscultation and percussion  Cardiac exam is unremarkable. S4 gallop is audible.  Abdomen is markedly obese. I am unable to feel the  spleen.  ASSESSMENT:  1. Acute coronary syndrome with severe three-vessel coronary disease but not an ideal candidate for surgical revascularization because of diffuse disease in the LAD. May be a better candidate for culprit circumflex angioplasty. We are currently trying to decide which approach would be best.  2. Diabetes with poor control  3. Splenomegaly with thrombocytopenia  4. Left upper quadrant pain when supine, presumed related in some way to the patient's spleen although could be musculoskeletal. Clearly not ischemic.  Plan:  1. Will continue medical therapy as we discussed the appropriate approach to ischemic heart disease. Will either be culprit circumflex PCI versus CABG (Dr. Tyrone Sage on the case).  2. Left upper quadrant pain of uncertain etiology. It is significant only when he is lying flat as on the catheter table yesterday. I will get a hematology consult to see what further if anything needs to be done. Thrombocytopenia is likely related to his spleen.  3. Continue low molecular weight heparin, beta blocker therapy, aspirin, and IV nitroglycerin.  Selinda Eon 09/03/2012, 9:45 AM

## 2012-09-03 NOTE — Progress Notes (Addendum)
VASCULAR LAB PRELIMINARY  PRELIMINARY  PRELIMINARY  PRELIMINARY  Pre-op Cardiac Surgery  Carotid Findings:  Bilateral:  Less than 39% ICA stenosis.  Vertebral artery flow is antegrade.    Thereasa Parkin, RVT 09-02-12  Upper Extremity Right Left  Brachial Pressures 134 Triphasic  131 Triphasic   Radial Waveforms Triphasic  Triphasic   Ulnar Waveforms Triphasic  Triphasic   Palmar Arch (Allen's Test) Within normal limits  Within normal limits     Lower  Extremity Right Left  Dorsalis Pedis 110  Biphasic  129 Biphasic   Anterior Tibial    Posterior Tibial .136 Biphasic  156 Biphasic   Ankle/Brachial Indices 1.01 1.06    Kynsley Whitehouse, RVT 09/03/2012, 10:33 AM

## 2012-09-03 NOTE — Progress Notes (Signed)
Reported by Resp. Dept. ,pt unable to do PFT due to bad gag reflex.

## 2012-09-03 NOTE — Consult Note (Signed)
[facility]  [phone]   ONCOLOGY  HOSPITAL CONSULTATION NOTE  Maurice Johnston                                MR#: [id number]  DOB: 11/30/1960                                      CSN#: [id number]  Referring MD: Dr. Verdis Prime [name] Primary MD: Dr.Wolters   Reason for Consult: Splenomegaly.   NWG:NFAOZH Eskew is a 52 y.o. male asked to see for evaluation of thrombocytopenia. In review, the patient was referred from PCP office  With 2 month history of exertional chest pain on 09/01/2012.Cardiac markers and CXR were non diagnostic. He was initiated on low molecular weight heparin, last dose on 6/20 at 9:43 am, at 120 injection. Marland KitchenHe underwent cardiac catheterization on 6/19, where he was diagnosed with Class 4 angina due to hih grade Circumflex obstruction. R coronary and diagonals have diabetic disease. Normal LVF. CT angio on 6/19 was negative for PE. But a CT of the abdomen and  Pelvis on 6/20  was positive for  enlarged spleen, measuring 15.4 x 6.2 x 12.2 cm (estimated splenic volume of 582 ml).  No other acute findings in the abdomen or pelvis to account for the patient's symptoms. Labs were remarkable for platelets 141, H/H normal at 14.8/41.5 and WBC 8.3. On 6/16, his platelets were 148k . Of note, patient reports platelet count from 08/2011 at his PCP office of 167k.He cannot find any other platelet levels to compare.    No gum bleed. No epistaxis or hemoptysis. Denies easy bruising.  Takes baby ASA daily. No significant amount of  NSAIDs.. Patient states that has never had a hematological evaluation prior to this admission. Never had a bone marrow biopsy.  Denies risk factors for HIV or hepatitis No blood in urine or in stool No PT or INR available. Family hematological history remarkable for mother and 2 siblings with a diagnosis of Factor 9 deficiency. Mother reports that he tested negative.  Smear has been ordered for review. We were kindly asked to see the  patient with recommendations  PMH:  Past Medical History  Diagnosis Date  . Diabetes mellitus without complication   . Hypertension   . Hypercholesterolemia        Chronic Kidney Disease Stage I      Obesity  Surgeries:  Past Surgical History  Procedure Laterality Date  . Vasectomy  1996    Allergies: No Known Allergies  Medications:    prior to admission:  Prescriptions prior to admission  Medication Sig Dispense Refill  . aspirin EC 81 MG tablet Take 81 mg by mouth daily.      . cyclobenzaprine (FLEXERIL) 10 MG tablet Take 1 tablet (10 mg total) by mouth 2 (two) times daily as needed for muscle spasms.  20 tablet  0  . esomeprazole (NEXIUM) 40 MG packet Take 40 mg by mouth at bedtime and may repeat dose one time if needed.      . insulin glargine (LANTUS) 100 UNIT/ML injection Inject 100 Units into the skin at bedtime.      . insulin lispro (HUMALOG) 100 UNIT/ML injection Inject 40 Units into the skin 3 (three) times daily before meals.      . insulin lispro (HUMALOG)  100 UNIT/ML injection Inject 1-20 Units into the skin See admin instructions. Sliding scale insulin tid with meals. Takes one unit for every 10 above 100 cbg.      . naproxen sodium (ANAPROX) 220 MG tablet Take 440 mg by mouth every evening. For foot pain      . omega-3 acid ethyl esters (LOVAZA) 1 G capsule Take 4 g by mouth at bedtime and may repeat dose one time if needed.      . Simethicone (GAS-X PO) Take 1 tablet by mouth every 4 (four) hours as needed (for indigestion).       . simvastatin (ZOCOR) 40 MG tablet Take 40 mg by mouth daily.      . Tadalafil (CIALIS PO) Take 1 tablet by mouth daily as needed (erectile dysfunction).      Marland Kitchen telmisartan (MICARDIS) 40 MG tablet Take 40 mg by mouth daily.        Marland Kitchen aspirin EC  81 mg Oral Daily  . enoxaparin (LOVENOX) injection  120 mg Subcutaneous Q12H  . famotidine  20 mg Oral Daily  . insulin aspart  0-15 Units Subcutaneous TID WC  . insulin aspart  40 Units  Subcutaneous TID AC  . insulin glargine  100 Units Subcutaneous QHS  . irbesartan  150 mg Oral Daily  . metoprolol tartrate  25 mg Oral BID  . simvastatin  40 mg Oral q1800    ZOX:[id number], cyclobenzaprine, insulin aspart, nitroGLYCERIN, ondansetron (ZOFRAN) IV, oxyCODONE-acetaminophen  ROS: Constitutional: trying to lose weight, change dietary lifestyle.. Negative for fever, chills or  night sweats. Negative for fatigue.  Eyes: Negative for blurred vision and double vision.  Respiratory: Negative for cough. No hemoptysis. No shortness of breath at this time.other symptoms as above. Cardiovascular As per HPI No palpitations.  GI: Negative for  nausea, vomiting, diarrhea or constipation. No change in bowel caliber. No  Melena or hematochezia.significant LUQ abdominal pain when lying flat. GU: Negative for hematuria. No loss of urinary control.No urinary retention. Skin: Negative for itching. No rash. No petechia. Denies easy bruising until this admission. No gum bleed.  Neurological: No headaches. No motor or sensory deficits.  Family History:  (per chart report) Father: deceased 50 yrs, colon cancer, alcoholism Mother: alive 11 yrs, breast cancer, hereditary Factor 9 deficiency, hypertension, heart disease, kidney disease, "skin cancer" ,  Colon polyps/diverticulitis Paternal Grand Father: deceased, MI Paternal Grand Mother: deceased, Unk nownMaternal Grand Father: deceased, MI Maternal Grand Mother: deceased, CVA Brother 1: alive 51 yrs, hypertension Brother2: alive 70 yrs, hypertension Brother 3: alive 23 yrs, A + W Sister 1: alive 55 yrs, A + W    Social History: Never smoked. Occasional alcohol. Drank heavily 20 years ago. 2-3 beers a week.Marland Kitchen He works as a Investment banker, operational. Married, 4 children in good health. Lives in [place].   Physical Exam    Filed Vitals:   09/03/12 1157  BP:   Pulse:   Temp: 98.1 F (36.7 C)  Resp:      Filed Weights   09/01/12 1549 09/02/12 0435  09/03/12 0339  Weight: 261 lb 11 oz (118.7 kg) 263 lb 0.1 oz (119.3 kg) 266 lb 1.5 oz (120.7 kg)    General:  52 -year-old  in no acute distress A. and O. x3  well-developed and ill-appearing HEENT: Normocephalic, atraumatic, PERRLA. Oral cavity without thrush or lesions. No gingival bleeding. Neck supple. no thyromegaly, no cervical or supraclavicular adenopathy  Lungs clear bilaterally . No wheezing, rhonchi or rales. No  axillary masses. Cardiac regular rate and rhythm,no murmur , rubs or gallops Abdomen soft nontender , bowel sounds x4. Cannot appreciate HSM. No masses palpable. Noticeable hematoma at the Lovenox injection site, up to 8-9 cm, non tender. Other areas of bruising at other injection site seen.NO petechiae.  GU/rectal: deferred. Extremities no clubbing cyanosis or edema. No  petechial rash Musculoskeletal: no spinal tenderness.  Neuro: Non Focal  Labs:     Recent Labs Lab 08/30/12 0630 09/02/12 0200  WBC 7.4 8.3  HGB 15.5 14.8  HCT 42.7 41.5  PLT 148* 141*  MCV 82.0 82.5  MCH 29.8 29.4  MCHC 36.3* 35.7  RDW 13.1 13.1  LYMPHSABS 1.5  --   MONOABS 0.5  --   EOSABS 0.1  --   BASOSABS 0.0  --          Recent Labs Lab 08/30/12 0630 09/01/12 1430  NA 137 137  K 4.0 3.7  CL 102 102  CO2 23 23  GLUCOSE 171* 83  BUN 16 19  CREATININE 0.72 0.83  CALCIUM 9.2 9.3  MG  --  1.8  AST 29 31  ALT 45 49  ALKPHOS 76 66  BILITOT 0.5 0.6        Component Value Date/Time   BILITOT 0.6 09/01/2012 1430     Imaging Studies:  Dg Chest 2 View  08/30/2012   *RADIOLOGY REPORT*  Clinical Data: Chest pain.  CHEST - 2 VIEW  Comparison: No priors.  Findings: Lung volumes are normal.  No consolidative airspace disease.  No pleural effusions.  No pneumothorax.  No pulmonary nodule or mass noted.  Pulmonary vasculature and the cardiomediastinal silhouette are within normal limits.  IMPRESSION: 1. No radiographic evidence of acute cardiopulmonary disease.   Original  Report Authenticated By: Trudie Reed, M.D.   Ct Angio Chest Pe W/cm &/or Wo Cm  09/03/2012  Comparison:  No priors.  CTA CHEST  Findings:  Mediastinum: Examination is limited by respiratory motion.  With these limitations in mind, there is no central, lobar or segmental sized pulmonary embolism.  Smaller subsegmental sized pulmonary embolism cannot be entirely excluded secondary to respiratory motion. Heart size is normal. There is atherosclerosis of the thoracic aorta, the great vessels of the mediastinum and the coronary arteries, including calcified atherosclerotic plaque in the left anterior descending and right coronary arteries.  There is no significant pericardial fluid, thickening or pericardial calcification. No pathologically enlarged mediastinal or hilar lymph nodes. Esophagus is unremarkable in appearance.  Lungs/Pleura: No acute consolidative airspace disease.  No pleural effusions.  No pneumothorax.  Linear opacities in the lower lobes of the lungs and in the inferior segment of the lingula are compatible with areas of subsegmental atelectasis and/or scarring. No definite suspicious appearing pulmonary nodules or masses are identified.  Musculoskeletal: There are no aggressive appearing lytic or blastic lesions noted in the visualized portions of the skeleton.   Review of the MIP images confirms the above findings.  IMPRESSION: 1.  Although the examination is limited by respiratory motion, there is no evidence to suggest clinically significant central, lobar or segmental sized pulmonary embolism. 2.  No acute findings in the thorax to account for the patient's symptoms. 3.  Atherosclerosis, including two-vessel coronary artery disease. Please note that although the presence of coronary artery calcium documents the presence of coronary artery disease, the severity of this disease and any potential stenosis cannot be assessed on this non-gated CT examination.  Assessment for potential risk factor  modification,  dietary therapy or pharmacologic therapy may be warranted, if clinically indicated. 4. Subsegmental atelectasis and/or scarring in the lung bases bilaterally, as above.  CT ABDOMEN AND PELVIS  Findings:  Abdomen/Pelvis:  The appearance of the liver, gallbladder, pancreas, bilateral adrenal glands and left kidney is unremarkable. In the upper pole of the right kidney there is a subcentimeter low attenuation lesion which is too small to definitively characterize, but favored to represent a small cyst.  The spleen is enlarged measuring 15.4 x 6.2 x 12.2 cm (estimated splenic volume of 582 ml).  Atherosclerosis throughout the abdominal and pelvic vasculature, without evidence of aneurysm or dissection.  No significant volume of ascites.  No pneumoperitoneum.  No pathologic distension of small bowel.  Normal appendix.  No definite pathologic lymphadenopathy identified within the abdomen or pelvis. Urinary bladder is unremarkable in appearance.  Musculoskeletal: Gas within the subcutaneous soft tissues of the lower anterior abdominal wall bilaterally, presumably iatrogenic. There are no aggressive appearing lytic or blastic lesions noted in the visualized portions of the skeleton.  Review of the MIP images confirms the above findings.  IMPRESSION: 1.  No acute findings in the abdomen or pelvis to account for the patient's symptoms. 2.  However, there is splenomegaly, as above. 3.  Atherosclerosis. 4.  Additional incidental findings, as above.   Original Report Authenticated By: Trudie Reed, M.D.   Ct Abdomen Pelvis W Contrast  09/03/2012   CT ABDOMEN AND PELVIS  Findings:  Abdomen/Pelvis:  The appearance of the liver, gallbladder, pancreas, bilateral adrenal glands and left kidney is unremarkable. In the upper pole of the right kidney there is a subcentimeter low attenuation lesion which is too small to definitively characterize, but favored to represent a small cyst.  The spleen is enlarged measuring  15.4 x 6.2 x 12.2 cm (estimated splenic volume of 582 ml).  Atherosclerosis throughout the abdominal and pelvic vasculature, without evidence of aneurysm or dissection.  No significant volume of ascites.  No pneumoperitoneum.  No pathologic distension of small bowel.  Normal appendix.  No definite pathologic lymphadenopathy identified within the abdomen or pelvis. Urinary bladder is unremarkable in appearance.  Musculoskeletal: Gas within the subcutaneous soft tissues of the lower anterior abdominal wall bilaterally, presumably iatrogenic. There are no aggressive appearing lytic or blastic lesions noted in the visualized portions of the skeleton.  Review of the MIP images confirms the above findings.  IMPRESSION: 1.  No acute findings in the abdomen or pelvis to account for the patient's symptoms. 2.  However, there is splenomegaly, as above. 3.  Atherosclerosis. 4.  Additional incidental findings, as above.   Original Report Authenticated By: Trudie Reed, M.D.      A/P: 52 y.o. male   Asked to see for evaluation of splenomegaly, thrombocytopenia in the setting of coronary disease. Smear has been ordered for review. DIC panel  HIT panel pending.   Dr. Arline Asp    is to see the patient following this consult with recommendations regarding diagnosis and  further workup studies. An addendum to this note is to be written.   Thank you for the referral.  Marcos Eke, PA-C 09/03/2012 12:27 PM  The patient was seen and examined. I have discussed his case with Drs. Katrinka Blazing and [place]. CT scans were reviewed with the radiologist. Peripheral blood smear is unremarkable. Platelets are normal size. No clumping was seen.  The patient's platelet count is borderline low. Today's platelet count was 161,000. DIC panel was negative. White count, hemoglobin, hematocrit and differential  are normal.  The spleen is mildly enlarged on the abdominal CT scan carried out earlier today. Estimated volume was 582 ML's with  normal being less than or equal to 411 ML's. Spleen is not palpable on physical exam, but the patient weighs 266 pounds. No tenderness or pain is present at this time. Whatever the etiology of this patient's slightly enlarged spleen, I am doubtful that this is clinically significant. I have ordered an LDH and a JAK2 mutational analysis (R/O MPD). The differential diagnosis for mild splenomegaly is quite extensive and includes normal for this patient, subclinical liver disease such as NASH, primary hematologic diseases and lipid/lysosomal storage diseases. I do not recommend a bone marrow exam at this time. I would suggest following spleen size with serial ultrasounds:  baseline in a couple months and then once or twice a year to assess stability, along with CBCs. If there are any significant changes, the patient will need to be reassessed by [place] and may need a bone marrow.  With regard to the patient's pain, I do not think there is any connection to the splenomegaly. The characteristics of the pain suggest a musculoskeletal origin in my opinion. The patient denies any pain at present and the abdomen, particularly the left upper quadrant was nontender to palpation.  The patient and his family were reassured. I don't see any contraindications for treatment of this patient's coronary artery disease with either stents or bypass surgery.   Samul Dada 09/03/2012 10:46 PM

## 2012-09-04 LAB — LACTATE DEHYDROGENASE: LDH: 158 U/L (ref 94–250)

## 2012-09-04 LAB — GLUCOSE, CAPILLARY: Glucose-Capillary: 107 mg/dL — ABNORMAL HIGH (ref 70–99)

## 2012-09-04 MED ORDER — SODIUM CHLORIDE 0.9 % IV SOLN
INTRAVENOUS | Status: DC
Start: 2012-09-04 — End: 2012-09-06
  Administered 2012-09-05: 10 mL/h via INTRAVENOUS

## 2012-09-04 NOTE — Progress Notes (Signed)
SUBJECTIVE:  No complaints this am  OBJECTIVE:   Vitals:   Filed Vitals:   09/03/12 2300 09/04/12 0000 09/04/12 0337 09/04/12 0754  BP: 137/65 141/79 135/74 149/69  Pulse:    70  Temp:  97.8 F (36.6 C) 98.6 F (37 C) 98.2 F (36.8 C)  TempSrc:  Oral Oral Oral  Resp:      Height:      Weight:   119.5 kg (263 lb 7.2 oz)   SpO2:  96% 96% 96%   I&O's:   Intake/Output Summary (Last 24 hours) at 09/04/12 0920 Last data filed at 09/04/12 0700  Gross per 24 hour  Intake   1309 ml  Output   1000 ml  Net    309 ml   TELEMETRY: Reviewed telemetry pt in NSR:     PHYSICAL EXAM General: Well developed, well nourished, in no acute distress Head: Eyes PERRLA, No xanthomas.   Normal cephalic and atramatic  Lungs:   Clear bilaterally to auscultation and percussion. Heart:   HRRR S1 S2 Pulses are 2+ & equal. Abdomen: Bowel sounds are positive, abdomen soft and non-tender without masses  Extremities:   No clubbing, cyanosis or edema.  DP +1 Neuro: Alert and oriented X 3. Psych:  Good affect, responds appropriately   LABS: Basic Metabolic Panel:  Recent Labs  16/10/96 1430  NA 137  K 3.7  CL 102  CO2 23  GLUCOSE 83  BUN 19  CREATININE 0.83  CALCIUM 9.3  MG 1.8   Liver Function Tests:  Recent Labs  09/01/12 1430  AST 31  ALT 49  ALKPHOS 66  BILITOT 0.6  PROT 7.1  ALBUMIN 4.2   No results found for this basename: LIPASE, AMYLASE,  in the last 72 hours CBC:  Recent Labs  09/02/12 0200 09/03/12 1357  WBC 8.3  --   HGB 14.8  --   HCT 41.5  --   MCV 82.5  --   PLT 141* 161   Cardiac Enzymes:  Recent Labs  09/01/12 1430 09/01/12 2000 09/02/12 0200  TROPONINI <0.30 <0.30 <0.30   BNP: No components found with this basename: POCBNP,  D-Dimer:  Recent Labs  09/03/12 1357  DDIMER <0.27   Hemoglobin A1C:  Recent Labs  09/01/12 1430  HGBA1C 7.6*   Fasting Lipid Panel:  Recent Labs  09/02/12 0200  CHOL 140  HDL 30*  LDLCALC 40  TRIG  350*  CHOLHDL 4.7   Thyroid Function Tests:  Recent Labs  09/01/12 1430  TSH 1.615   Anemia Panel: No results found for this basename: VITAMINB12, FOLATE, FERRITIN, TIBC, IRON, RETICCTPCT,  in the last 72 hours Coag Panel:   Lab Results  Component Value Date   INR 1.01 09/03/2012    RADIOLOGY: Dg Chest 2 View  08/30/2012   *RADIOLOGY REPORT*  Clinical Data: Chest pain.  CHEST - 2 VIEW  Comparison: No priors.  Findings: Lung volumes are normal.  No consolidative airspace disease.  No pleural effusions.  No pneumothorax.  No pulmonary nodule or mass noted.  Pulmonary vasculature and the cardiomediastinal silhouette are within normal limits.  IMPRESSION: 1. No radiographic evidence of acute cardiopulmonary disease.   Original Report Authenticated By: Trudie Reed, M.D.   Ct Angio Chest Pe W/cm &/or Wo Cm  09/03/2012   *RADIOLOGY REPORT*  Clinical Data:  Left sided pleuritic chest pain.  Left upper quadrant abdominal pain.  CT ANGIOGRAPHY CHEST CT ABDOMEN AND PELVIS WITH CONTRAST  Technique:  Multidetector  CT imaging of the chest was performed using the standard protocol during bolus administration of intravenous contrast.  Multiplanar CT image reconstructions including MIPs were obtained to evaluate the vascular anatomy. Multidetector CT imaging of the abdomen and pelvis was performed using the standard protocol during bolus administration of intravenous contrast.  Contrast: OMNIPAQUE IOHEXOL 350 MG/ML SOLN  Comparison:  No priors.  CTA CHEST  Findings:  Mediastinum: Examination is limited by respiratory motion.  With these limitations in mind, there is no central, lobar or segmental sized pulmonary embolism.  Smaller subsegmental sized pulmonary embolism cannot be entirely excluded secondary to respiratory motion. Heart size is normal. There is atherosclerosis of the thoracic aorta, the great vessels of the mediastinum and the coronary arteries, including calcified atherosclerotic plaque  in the left anterior descending and right coronary arteries.  There is no significant pericardial fluid, thickening or pericardial calcification. No pathologically enlarged mediastinal or hilar lymph nodes. Esophagus is unremarkable in appearance.  Lungs/Pleura: No acute consolidative airspace disease.  No pleural effusions.  No pneumothorax.  Linear opacities in the lower lobes of the lungs and in the inferior segment of the lingula are compatible with areas of subsegmental atelectasis and/or scarring. No definite suspicious appearing pulmonary nodules or masses are identified.  Musculoskeletal: There are no aggressive appearing lytic or blastic lesions noted in the visualized portions of the skeleton.   Review of the MIP images confirms the above findings.  IMPRESSION: 1.  Although the examination is limited by respiratory motion, there is no evidence to suggest clinically significant central, lobar or segmental sized pulmonary embolism. 2.  No acute findings in the thorax to account for the patient's symptoms. 3.  Atherosclerosis, including two-vessel coronary artery disease. Please note that although the presence of coronary artery calcium documents the presence of coronary artery disease, the severity of this disease and any potential stenosis cannot be assessed on this non-gated CT examination.  Assessment for potential risk factor modification, dietary therapy or pharmacologic therapy may be warranted, if clinically indicated. 4. Subsegmental atelectasis and/or scarring in the lung bases bilaterally, as above.  CT ABDOMEN AND PELVIS  Findings:  Abdomen/Pelvis:  The appearance of the liver, gallbladder, pancreas, bilateral adrenal glands and left kidney is unremarkable. In the upper pole of the right kidney there is a subcentimeter low attenuation lesion which is too small to definitively characterize, but favored to represent a small cyst.  The spleen is enlarged measuring 15.4 x 6.2 x 12.2 cm (estimated  splenic volume of 582 ml).  Atherosclerosis throughout the abdominal and pelvic vasculature, without evidence of aneurysm or dissection.  No significant volume of ascites.  No pneumoperitoneum.  No pathologic distension of small bowel.  Normal appendix.  No definite pathologic lymphadenopathy identified within the abdomen or pelvis. Urinary bladder is unremarkable in appearance.  Musculoskeletal: Gas within the subcutaneous soft tissues of the lower anterior abdominal wall bilaterally, presumably iatrogenic. There are no aggressive appearing lytic or blastic lesions noted in the visualized portions of the skeleton.  Review of the MIP images confirms the above findings.  IMPRESSION: 1.  No acute findings in the abdomen or pelvis to account for the patient's symptoms. 2.  However, there is splenomegaly, as above. 3.  Atherosclerosis. 4.  Additional incidental findings, as above.   Original Report Authenticated By: Trudie Reed, M.D.   Ct Abdomen Pelvis W Contrast  09/03/2012   *RADIOLOGY REPORT*  Clinical Data:  Left sided pleuritic chest pain.  Left upper quadrant abdominal pain.  CT ANGIOGRAPHY CHEST CT ABDOMEN AND PELVIS WITH CONTRAST  Technique:  Multidetector CT imaging of the chest was performed using the standard protocol during bolus administration of intravenous contrast.  Multiplanar CT image reconstructions including MIPs were obtained to evaluate the vascular anatomy. Multidetector CT imaging of the abdomen and pelvis was performed using the standard protocol during bolus administration of intravenous contrast.  Contrast: OMNIPAQUE IOHEXOL 350 MG/ML SOLN  Comparison:  No priors.  CTA CHEST  Findings:  Mediastinum: Examination is limited by respiratory motion.  With these limitations in mind, there is no central, lobar or segmental sized pulmonary embolism.  Smaller subsegmental sized pulmonary embolism cannot be entirely excluded secondary to respiratory motion. Heart size is normal. There is  atherosclerosis of the thoracic aorta, the great vessels of the mediastinum and the coronary arteries, including calcified atherosclerotic plaque in the left anterior descending and right coronary arteries.  There is no significant pericardial fluid, thickening or pericardial calcification. No pathologically enlarged mediastinal or hilar lymph nodes. Esophagus is unremarkable in appearance.  Lungs/Pleura: No acute consolidative airspace disease.  No pleural effusions.  No pneumothorax.  Linear opacities in the lower lobes of the lungs and in the inferior segment of the lingula are compatible with areas of subsegmental atelectasis and/or scarring. No definite suspicious appearing pulmonary nodules or masses are identified.  Musculoskeletal: There are no aggressive appearing lytic or blastic lesions noted in the visualized portions of the skeleton.   Review of the MIP images confirms the above findings.  IMPRESSION: 1.  Although the examination is limited by respiratory motion, there is no evidence to suggest clinically significant central, lobar or segmental sized pulmonary embolism. 2.  No acute findings in the thorax to account for the patient's symptoms. 3.  Atherosclerosis, including two-vessel coronary artery disease. Please note that although the presence of coronary artery calcium documents the presence of coronary artery disease, the severity of this disease and any potential stenosis cannot be assessed on this non-gated CT examination.  Assessment for potential risk factor modification, dietary therapy or pharmacologic therapy may be warranted, if clinically indicated. 4. Subsegmental atelectasis and/or scarring in the lung bases bilaterally, as above.  CT ABDOMEN AND PELVIS  Findings:  Abdomen/Pelvis:  The appearance of the liver, gallbladder, pancreas, bilateral adrenal glands and left kidney is unremarkable. In the upper pole of the right kidney there is a subcentimeter low attenuation lesion which is too  small to definitively characterize, but favored to represent a small cyst.  The spleen is enlarged measuring 15.4 x 6.2 x 12.2 cm (estimated splenic volume of 582 ml).  Atherosclerosis throughout the abdominal and pelvic vasculature, without evidence of aneurysm or dissection.  No significant volume of ascites.  No pneumoperitoneum.  No pathologic distension of small bowel.  Normal appendix.  No definite pathologic lymphadenopathy identified within the abdomen or pelvis. Urinary bladder is unremarkable in appearance.  Musculoskeletal: Gas within the subcutaneous soft tissues of the lower anterior abdominal wall bilaterally, presumably iatrogenic. There are no aggressive appearing lytic or blastic lesions noted in the visualized portions of the skeleton.  Review of the MIP images confirms the above findings.  IMPRESSION: 1.  No acute findings in the abdomen or pelvis to account for the patient's symptoms. 2.  However, there is splenomegaly, as above. 3.  Atherosclerosis. 4.  Additional incidental findings, as above.   Original Report Authenticated By: Trudie Reed, M.D.    ASSESSMENT:  1. Acute coronary syndrome with severe three-vessel coronary disease but  not an ideal candidate for surgical revascularization because of diffuse disease in the LAD. May be a better candidate for culprit circumflex angioplasty. We are currently trying to decide which approach would be best.  2. Diabetes with poor control  3. Splenomegaly with thrombocytopenia - hematology recommends followup with serial ultrasounds 4. Left upper quadrant pain when supine, presumed musculoskeletal. Clearly not ischemic.  Plan:  1. Will continue medical therapy as we discussed the appropriate approach to ischemic heart disease. Will either be culprit circumflex PCI versus CABG (Dr. Tyrone Sage on the case).  2. Left upper quadrant pain of uncertain etiology. It is significant only when he is lying flat as on the catheter table. CT scan  remarkable only for splenomegaly. Appreciate hematology input - pain felt musculoskeletal in origin and not related to splenomegaly 3. Continue low molecular weight heparin, beta blocker therapy, aspirin, and IV nitroglycerin.    Quintella Reichert, MD  09/04/2012  9:20 AM

## 2012-09-04 NOTE — Progress Notes (Signed)
Patient ID: Maurice Johnston, male   DOB: 1960/08/24, 52 y.o.   MRN: [id number] .      [address]       [place] [address]             [phone]                 2 Days Post-Op Procedure(s) (LRB): LEFT HEART CATHETERIZATION WITH CORONARY ANGIOGRAM (N/A)  LOS: 3 days   Subjective: Sitting in chair, no chest pain or flank pain today.   Objective: Vital signs in last 24 hours: Patient Vitals for the past 24 hrs:  BP Temp Temp src Pulse Resp SpO2 Weight  09/04/12 1928 143/69 mmHg 98.1 F (36.7 C) Oral - 21 96 % -  09/04/12 1639 139/62 mmHg 98.1 F (36.7 C) Oral 72 - 95 % -  09/04/12 1221 135/72 mmHg 98.1 F (36.7 C) Oral 74 - 96 % -  09/04/12 1018 151/73 mmHg - - 80 - 97 % -  09/04/12 0754 149/69 mmHg 98.2 F (36.8 C) Oral 70 - 96 % -  09/04/12 0337 135/74 mmHg 98.6 F (37 C) Oral - - 96 % 263 lb 7.2 oz (119.5 kg)  09/04/12 0000 141/79 mmHg 97.8 F (36.6 C) Oral - - 96 % -  09/03/12 2300 137/65 mmHg - - - - - -  09/03/12 2200 134/58 mmHg - - - - - -    Filed Weights   09/02/12 0435 09/03/12 0339 09/04/12 0337  Weight: 263 lb 0.1 oz (119.3 kg) 266 lb 1.5 oz (120.7 kg) 263 lb 7.2 oz (119.5 kg)    Hemodynamic parameters for last 24 hours:    Intake/Output from previous day: 06/20 0701 - 06/21 0700 In: 1727 [P.O.:850; I.V.:877] Out: 1000 [Urine:1000] Intake/Output this shift: Total I/O In: 34.3 [I.V.:34.3] Out: -   Scheduled Meds: . aspirin EC  81 mg Oral Daily  . enoxaparin (LOVENOX) injection  120 mg Subcutaneous Q12H  . famotidine  20 mg Oral Daily  . insulin aspart  0-15 Units Subcutaneous TID WC  . insulin aspart  40 Units Subcutaneous TID AC  . insulin glargine  100 Units Subcutaneous QHS  . irbesartan  150 mg Oral Daily  . metoprolol tartrate  25 mg Oral BID  . simvastatin  40 mg Oral q1800   Continuous Infusions: . sodium chloride 10 mL (09/04/12 1927)  . nitroGLYCERIN 25 mcg/min (09/04/12 1927)   PRN Meds:.acetaminophen,  cyclobenzaprine, insulin aspart, nitroGLYCERIN, ondansetron (ZOFRAN) IV, oxyCODONE-acetaminophen  General appearance: alert, cooperative, appears older than stated age and no distress Neurologic: intact Heart: regular rate and rhythm, S1, S2 normal, no murmur, click, rub or gallop Lungs: clear to auscultation bilaterally Abdomen: soft, non-tender; bowel sounds normal; no masses,  no organomegaly by palpation, but obesity make difficult to palpated known enlarged spleen Extremities: extremities normal, atraumatic, no cyanosis or edema and Homans sign is negative, no sign of DVT Rt radial cath site ok  Lab Results: CBC: Recent Labs  09/02/12 0200 09/03/12 1357  WBC 8.3  --   HGB 14.8  --   HCT 41.5  --   PLT 141* 161   BMET: No results found for this basename: NA, K, CL, CO2, GLUCOSE, BUN, CREATININE, CALCIUM,  in the last 72 hours  PT/INR:  Recent Labs  09/03/12 1357  LABPROT 13.2  INR 1.01     Radiology Ct Angio Chest Pe W/cm &/or Wo Cm  09/03/2012   *RADIOLOGY REPORT*  Clinical Data:  Left sided pleuritic chest pain.  Left upper quadrant abdominal pain.  CT ANGIOGRAPHY CHEST CT ABDOMEN AND PELVIS WITH CONTRAST  Technique:  Multidetector CT imaging of the chest was performed using the standard protocol during bolus administration of intravenous contrast.  Multiplanar CT image reconstructions including MIPs were obtained to evaluate the vascular anatomy. Multidetector CT imaging of the abdomen and pelvis was performed using the standard protocol during bolus administration of intravenous contrast.  Contrast: OMNIPAQUE IOHEXOL 350 MG/ML SOLN  Comparison:  No priors.  CTA CHEST  Findings:  Mediastinum: Examination is limited by respiratory motion.  With these limitations in mind, there is no central, lobar or segmental sized pulmonary embolism.  Smaller subsegmental sized pulmonary embolism cannot be entirely excluded secondary to respiratory motion. Heart size is normal. There is  atherosclerosis of the thoracic aorta, the great vessels of the mediastinum and the coronary arteries, including calcified atherosclerotic plaque in the left anterior descending and right coronary arteries.  There is no significant pericardial fluid, thickening or pericardial calcification. No pathologically enlarged mediastinal or hilar lymph nodes. Esophagus is unremarkable in appearance.  Lungs/Pleura: No acute consolidative airspace disease.  No pleural effusions.  No pneumothorax.  Linear opacities in the lower lobes of the lungs and in the inferior segment of the lingula are compatible with areas of subsegmental atelectasis and/or scarring. No definite suspicious appearing pulmonary nodules or masses are identified.  Musculoskeletal: There are no aggressive appearing lytic or blastic lesions noted in the visualized portions of the skeleton.   Review of the MIP images confirms the above findings.  IMPRESSION: 1.  Although the examination is limited by respiratory motion, there is no evidence to suggest clinically significant central, lobar or segmental sized pulmonary embolism. 2.  No acute findings in the thorax to account for the patient's symptoms. 3.  Atherosclerosis, including two-vessel coronary artery disease. Please note that although the presence of coronary artery calcium documents the presence of coronary artery disease, the severity of this disease and any potential stenosis cannot be assessed on this non-gated CT examination.  Assessment for potential risk factor modification, dietary therapy or pharmacologic therapy may be warranted, if clinically indicated. 4. Subsegmental atelectasis and/or scarring in the lung bases bilaterally, as above.  CT ABDOMEN AND PELVIS  Findings:  Abdomen/Pelvis:  The appearance of the liver, gallbladder, pancreas, bilateral adrenal glands and left kidney is unremarkable. In the upper pole of the right kidney there is a subcentimeter low attenuation lesion which is too  small to definitively characterize, but favored to represent a small cyst.  The spleen is enlarged measuring 15.4 x 6.2 x 12.2 cm (estimated splenic volume of 582 ml).  Atherosclerosis throughout the abdominal and pelvic vasculature, without evidence of aneurysm or dissection.  No significant volume of ascites.  No pneumoperitoneum.  No pathologic distension of small bowel.  Normal appendix.  No definite pathologic lymphadenopathy identified within the abdomen or pelvis. Urinary bladder is unremarkable in appearance.  Musculoskeletal: Gas within the subcutaneous soft tissues of the lower anterior abdominal wall bilaterally, presumably iatrogenic. There are no aggressive appearing lytic or blastic lesions noted in the visualized portions of the skeleton.  Review of the MIP images confirms the above findings.  IMPRESSION: 1.  No acute findings in the abdomen or pelvis to account for the patient's symptoms. 2.  However, there is splenomegaly, as above. 3.  Atherosclerosis. 4.  Additional incidental findings, as above.   Original Report Authenticated By: Trudie Reed, M.D.  Ct Abdomen Pelvis W Contrast  09/03/2012   *RADIOLOGY REPORT*  Clinical Data:  Left sided pleuritic chest pain.  Left upper quadrant abdominal pain.  CT ANGIOGRAPHY CHEST CT ABDOMEN AND PELVIS WITH CONTRAST  Technique:  Multidetector CT imaging of the chest was performed using the standard protocol during bolus administration of intravenous contrast.  Multiplanar CT image reconstructions including MIPs were obtained to evaluate the vascular anatomy. Multidetector CT imaging of the abdomen and pelvis was performed using the standard protocol during bolus administration of intravenous contrast.  Contrast: OMNIPAQUE IOHEXOL 350 MG/ML SOLN  Comparison:  No priors.  CTA CHEST  Findings:  Mediastinum: Examination is limited by respiratory motion.  With these limitations in mind, there is no central, lobar or segmental sized pulmonary embolism.   Smaller subsegmental sized pulmonary embolism cannot be entirely excluded secondary to respiratory motion. Heart size is normal. There is atherosclerosis of the thoracic aorta, the great vessels of the mediastinum and the coronary arteries, including calcified atherosclerotic plaque in the left anterior descending and right coronary arteries.  There is no significant pericardial fluid, thickening or pericardial calcification. No pathologically enlarged mediastinal or hilar lymph nodes. Esophagus is unremarkable in appearance.  Lungs/Pleura: No acute consolidative airspace disease.  No pleural effusions.  No pneumothorax.  Linear opacities in the lower lobes of the lungs and in the inferior segment of the lingula are compatible with areas of subsegmental atelectasis and/or scarring. No definite suspicious appearing pulmonary nodules or masses are identified.  Musculoskeletal: There are no aggressive appearing lytic or blastic lesions noted in the visualized portions of the skeleton.   Review of the MIP images confirms the above findings.  IMPRESSION: 1.  Although the examination is limited by respiratory motion, there is no evidence to suggest clinically significant central, lobar or segmental sized pulmonary embolism. 2.  No acute findings in the thorax to account for the patient's symptoms. 3.  Atherosclerosis, including two-vessel coronary artery disease. Please note that although the presence of coronary artery calcium documents the presence of coronary artery disease, the severity of this disease and any potential stenosis cannot be assessed on this non-gated CT examination.  Assessment for potential risk factor modification, dietary therapy or pharmacologic therapy may be warranted, if clinically indicated. 4. Subsegmental atelectasis and/or scarring in the lung bases bilaterally, as above.  CT ABDOMEN AND PELVIS  Findings:  Abdomen/Pelvis:  The appearance of the liver, gallbladder, pancreas, bilateral adrenal  glands and left kidney is unremarkable. In the upper pole of the right kidney there is a subcentimeter low attenuation lesion which is too small to definitively characterize, but favored to represent a small cyst.  The spleen is enlarged measuring 15.4 x 6.2 x 12.2 cm (estimated splenic volume of 582 ml).  Atherosclerosis throughout the abdominal and pelvic vasculature, without evidence of aneurysm or dissection.  No significant volume of ascites.  No pneumoperitoneum.  No pathologic distension of small bowel.  Normal appendix.  No definite pathologic lymphadenopathy identified within the abdomen or pelvis. Urinary bladder is unremarkable in appearance.  Musculoskeletal: Gas within the subcutaneous soft tissues of the lower anterior abdominal wall bilaterally, presumably iatrogenic. There are no aggressive appearing lytic or blastic lesions noted in the visualized portions of the skeleton.  Review of the MIP images confirms the above findings.  IMPRESSION: 1.  No acute findings in the abdomen or pelvis to account for the patient's symptoms. 2.  However, there is splenomegaly, as above. 3.  Atherosclerosis. 4.  Additional incidental  findings, as above.   Original Report Authenticated By: Trudie Reed, M.D.     Assessment/Plan: S/P Procedure(s) (LRB): LEFT HEART CATHETERIZATION WITH CORONARY ANGIOGRAM (N/A) Again reviewed with the patient the treatment options, including culprit angioplasty vs CABG. He has also discussed with Dr Katrinka Blazing Patient understands regardless of treatment option the aggressive modification of risk factors for CAD need to be managed. At this point patient prefers to proceed with angioplasty/stent, Dr Katrinka Blazing has arranged  for Monday. Case discussed with Dr Katrinka Blazing.   Delight Ovens MD 09/04/2012 9:24 PM

## 2012-09-05 LAB — CBC
HCT: 41.9 % (ref 39.0–52.0)
Hemoglobin: 14.7 g/dL (ref 13.0–17.0)
MCH: 29.1 pg (ref 26.0–34.0)
MCH: 29.2 pg (ref 26.0–34.0)
MCHC: 35.5 g/dL (ref 30.0–36.0)
MCV: 82.8 fL (ref 78.0–100.0)
Platelets: 161 10*3/uL (ref 150–400)
RBC: 5.06 MIL/uL (ref 4.22–5.81)
RBC: 5.37 MIL/uL (ref 4.22–5.81)

## 2012-09-05 LAB — BASIC METABOLIC PANEL
CO2: 22 mEq/L (ref 19–32)
Chloride: 99 mEq/L (ref 96–112)
Creatinine, Ser: 0.74 mg/dL (ref 0.50–1.35)
Creatinine, Ser: 0.78 mg/dL (ref 0.50–1.35)
GFR calc Af Amer: >90 mL/min (ref >90)
GFR calc Af Amer: >90 mL/min (ref >90)
GFR calc non Af Amer: >90 mL/min (ref >90)
Glucose, Bld: 142 mg/dL — ABNORMAL HIGH (ref 70–99)
Sodium: 138 mEq/L (ref 135–145)

## 2012-09-05 LAB — GLUCOSE, CAPILLARY
Glucose-Capillary: 135 mg/dL — ABNORMAL HIGH (ref 70–99)
Glucose-Capillary: 147 mg/dL — ABNORMAL HIGH (ref 70–99)
Glucose-Capillary: 129 mg/dL — ABNORMAL HIGH (ref 70–99)

## 2012-09-05 LAB — PLATELET INHIBITION P2Y12: Platelet Function  P2Y12: 167 [PRU] — ABNORMAL LOW (ref 194–418)

## 2012-09-05 LAB — PROTIME-INR: Prothrombin Time: 12.7 seconds (ref 11.6–15.2)

## 2012-09-05 MED ORDER — ASPIRIN 81 MG PO CHEW: 324.0000 mg | CHEWABLE_TABLET | ORAL | Status: DC

## 2012-09-05 MED ORDER — SODIUM CHLORIDE 0.9 % IJ SOLN
3.0000 mL | Freq: Two times a day (BID) | INTRAMUSCULAR | Status: DC
  Administered 2012-09-05: 3 mL via INTRAVENOUS

## 2012-09-05 MED ORDER — DIAZEPAM 2 MG PO TABS
2.0000 mg | ORAL_TABLET | ORAL | Status: AC
  Administered 2012-09-06: 2 mg via ORAL
  Filled 2012-09-05: qty 1

## 2012-09-05 MED ORDER — TICAGRELOR 90 MG PO TABS
180.0000 mg | ORAL_TABLET | Freq: Once | ORAL | Status: AC
  Administered 2012-09-05: 180 mg via ORAL
  Filled 2012-09-05: qty 2

## 2012-09-05 MED ORDER — SODIUM CHLORIDE 0.9 % IV SOLN
INTRAVENOUS | Status: DC
  Administered 2012-09-06: 75 mL via INTRAVENOUS

## 2012-09-05 MED ORDER — SODIUM CHLORIDE 0.9 % IV SOLN: 250.0000 mL | INTRAVENOUS | Status: DC | PRN

## 2012-09-05 MED ORDER — SODIUM CHLORIDE 0.9 % IJ SOLN: 3.0000 mL | INTRAMUSCULAR | Status: DC | PRN

## 2012-09-05 NOTE — Progress Notes (Signed)
Report from Night RN. Chart reviewed together. Handoff complete.Introductions complete. Will continue to monitor and advise attending as needed.   

## 2012-09-05 NOTE — Progress Notes (Signed)
SUBJECTIVE:  Had CP last PM which resolved with increased IV NTG  OBJECTIVE:   Vitals:   Filed Vitals:   09/04/12 2129 09/04/12 2313 09/05/12 0310 09/05/12 0700  BP: 128/67 137/65 125/66   Pulse: 70     Temp:  98.2 F (36.8 C) 98.8 F (37.1 C) 97.8 F (36.6 C)  TempSrc:  Oral Oral Oral  Resp:  20 18   Height:      Weight:   118.6 kg (261 lb 7.5 oz)   SpO2:  94% 95%    I&O's:   Intake/Output Summary (Last 24 hours) at 09/05/12 0757 Last data filed at 09/05/12 0600  Gross per 24 hour  Intake 1119.83 ml  Output    800 ml  Net 319.83 ml   TELEMETRY: Reviewed telemetry pt in NSR:     PHYSICAL EXAM General: Well developed, well nourished, in no acute distress Head: Eyes PERRLA, No xanthomas.   Normal cephalic and atramatic  Lungs:   Clear bilaterally to auscultation and percussion. Heart:   HRRR S1 S2 Pulses are 2+ & equal. Abdomen: Bowel sounds are positive, abdomen soft and non-tender without masses  Extremities:   No clubbing, cyanosis or edema.  DP +1 Neuro: Alert and oriented X 3. Psych:  Good affect, responds appropriately   LABS: Basic Metabolic Panel:  Recent Labs  16/10/96 0355  NA 134*  K 3.6  CL 100  CO2 22  GLUCOSE 142*  BUN 12  CREATININE 0.74  CALCIUM 9.1   Liver Function Tests: No results found for this basename: AST, ALT, ALKPHOS, BILITOT, PROT, ALBUMIN,  in the last 72 hours No results found for this basename: LIPASE, AMYLASE,  in the last 72 hours CBC:  Recent Labs  09/03/12 1357 09/05/12 0355  WBC  --  7.3  HGB  --  14.7  HCT  --  41.9  MCV  --  82.8  PLT 161 139*   Cardiac Enzymes: No results found for this basename: CKTOTAL, CKMB, CKMBINDEX, TROPONINI,  in the last 72 hours BNP: No components found with this basename: POCBNP,  D-Dimer:  Recent Labs  09/03/12 1357  DDIMER <0.27   Coag Panel:   Lab Results  Component Value Date   INR 1.01 09/03/2012    RADIOLOGY: Dg Chest 2 View  08/30/2012   *RADIOLOGY REPORT*   Clinical Data: Chest pain.  CHEST - 2 VIEW  Comparison: No priors.  Findings: Lung volumes are normal.  No consolidative airspace disease.  No pleural effusions.  No pneumothorax.  No pulmonary nodule or mass noted.  Pulmonary vasculature and the cardiomediastinal silhouette are within normal limits.  IMPRESSION: 1. No radiographic evidence of acute cardiopulmonary disease.   Original Report Authenticated By: Trudie Reed, M.D.   Ct Angio Chest Pe W/cm &/or Wo Cm  09/03/2012   *RADIOLOGY REPORT*  Clinical Data:  Left sided pleuritic chest pain.  Left upper quadrant abdominal pain.  CT ANGIOGRAPHY CHEST CT ABDOMEN AND PELVIS WITH CONTRAST  Technique:  Multidetector CT imaging of the chest was performed using the standard protocol during bolus administration of intravenous contrast.  Multiplanar CT image reconstructions including MIPs were obtained to evaluate the vascular anatomy. Multidetector CT imaging of the abdomen and pelvis was performed using the standard protocol during bolus administration of intravenous contrast.  Contrast: OMNIPAQUE IOHEXOL 350 MG/ML SOLN  Comparison:  No priors.  CTA CHEST  Findings:  Mediastinum: Examination is limited by respiratory motion.  With these limitations in mind,  there is no central, lobar or segmental sized pulmonary embolism.  Smaller subsegmental sized pulmonary embolism cannot be entirely excluded secondary to respiratory motion. Heart size is normal. There is atherosclerosis of the thoracic aorta, the great vessels of the mediastinum and the coronary arteries, including calcified atherosclerotic plaque in the left anterior descending and right coronary arteries.  There is no significant pericardial fluid, thickening or pericardial calcification. No pathologically enlarged mediastinal or hilar lymph nodes. Esophagus is unremarkable in appearance.  Lungs/Pleura: No acute consolidative airspace disease.  No pleural effusions.  No pneumothorax.  Linear opacities  in the lower lobes of the lungs and in the inferior segment of the lingula are compatible with areas of subsegmental atelectasis and/or scarring. No definite suspicious appearing pulmonary nodules or masses are identified.  Musculoskeletal: There are no aggressive appearing lytic or blastic lesions noted in the visualized portions of the skeleton.   Review of the MIP images confirms the above findings.  IMPRESSION: 1.  Although the examination is limited by respiratory motion, there is no evidence to suggest clinically significant central, lobar or segmental sized pulmonary embolism. 2.  No acute findings in the thorax to account for the patient's symptoms. 3.  Atherosclerosis, including two-vessel coronary artery disease. Please note that although the presence of coronary artery calcium documents the presence of coronary artery disease, the severity of this disease and any potential stenosis cannot be assessed on this non-gated CT examination.  Assessment for potential risk factor modification, dietary therapy or pharmacologic therapy may be warranted, if clinically indicated. 4. Subsegmental atelectasis and/or scarring in the lung bases bilaterally, as above.  CT ABDOMEN AND PELVIS  Findings:  Abdomen/Pelvis:  The appearance of the liver, gallbladder, pancreas, bilateral adrenal glands and left kidney is unremarkable. In the upper pole of the right kidney there is a subcentimeter low attenuation lesion which is too small to definitively characterize, but favored to represent a small cyst.  The spleen is enlarged measuring 15.4 x 6.2 x 12.2 cm (estimated splenic volume of 582 ml).  Atherosclerosis throughout the abdominal and pelvic vasculature, without evidence of aneurysm or dissection.  No significant volume of ascites.  No pneumoperitoneum.  No pathologic distension of small bowel.  Normal appendix.  No definite pathologic lymphadenopathy identified within the abdomen or pelvis. Urinary bladder is unremarkable  in appearance.  Musculoskeletal: Gas within the subcutaneous soft tissues of the lower anterior abdominal wall bilaterally, presumably iatrogenic. There are no aggressive appearing lytic or blastic lesions noted in the visualized portions of the skeleton.  Review of the MIP images confirms the above findings.  IMPRESSION: 1.  No acute findings in the abdomen or pelvis to account for the patient's symptoms. 2.  However, there is splenomegaly, as above. 3.  Atherosclerosis. 4.  Additional incidental findings, as above.   Original Report Authenticated By: Trudie Reed, M.D.   Ct Abdomen Pelvis W Contrast  09/03/2012   *RADIOLOGY REPORT*  Clinical Data:  Left sided pleuritic chest pain.  Left upper quadrant abdominal pain.  CT ANGIOGRAPHY CHEST CT ABDOMEN AND PELVIS WITH CONTRAST  Technique:  Multidetector CT imaging of the chest was performed using the standard protocol during bolus administration of intravenous contrast.  Multiplanar CT image reconstructions including MIPs were obtained to evaluate the vascular anatomy. Multidetector CT imaging of the abdomen and pelvis was performed using the standard protocol during bolus administration of intravenous contrast.  Contrast: OMNIPAQUE IOHEXOL 350 MG/ML SOLN  Comparison:  No priors.  CTA CHEST  Findings:  Mediastinum: Examination is limited by respiratory motion.  With these limitations in mind, there is no central, lobar or segmental sized pulmonary embolism.  Smaller subsegmental sized pulmonary embolism cannot be entirely excluded secondary to respiratory motion. Heart size is normal. There is atherosclerosis of the thoracic aorta, the great vessels of the mediastinum and the coronary arteries, including calcified atherosclerotic plaque in the left anterior descending and right coronary arteries.  There is no significant pericardial fluid, thickening or pericardial calcification. No pathologically enlarged mediastinal or hilar lymph nodes. Esophagus is  unremarkable in appearance.  Lungs/Pleura: No acute consolidative airspace disease.  No pleural effusions.  No pneumothorax.  Linear opacities in the lower lobes of the lungs and in the inferior segment of the lingula are compatible with areas of subsegmental atelectasis and/or scarring. No definite suspicious appearing pulmonary nodules or masses are identified.  Musculoskeletal: There are no aggressive appearing lytic or blastic lesions noted in the visualized portions of the skeleton.   Review of the MIP images confirms the above findings.  IMPRESSION: 1.  Although the examination is limited by respiratory motion, there is no evidence to suggest clinically significant central, lobar or segmental sized pulmonary embolism. 2.  No acute findings in the thorax to account for the patient's symptoms. 3.  Atherosclerosis, including two-vessel coronary artery disease. Please note that although the presence of coronary artery calcium documents the presence of coronary artery disease, the severity of this disease and any potential stenosis cannot be assessed on this non-gated CT examination.  Assessment for potential risk factor modification, dietary therapy or pharmacologic therapy may be warranted, if clinically indicated. 4. Subsegmental atelectasis and/or scarring in the lung bases bilaterally, as above.  CT ABDOMEN AND PELVIS  Findings:  Abdomen/Pelvis:  The appearance of the liver, gallbladder, pancreas, bilateral adrenal glands and left kidney is unremarkable. In the upper pole of the right kidney there is a subcentimeter low attenuation lesion which is too small to definitively characterize, but favored to represent a small cyst.  The spleen is enlarged measuring 15.4 x 6.2 x 12.2 cm (estimated splenic volume of 582 ml).  Atherosclerosis throughout the abdominal and pelvic vasculature, without evidence of aneurysm or dissection.  No significant volume of ascites.  No pneumoperitoneum.  No pathologic distension of  small bowel.  Normal appendix.  No definite pathologic lymphadenopathy identified within the abdomen or pelvis. Urinary bladder is unremarkable in appearance.  Musculoskeletal: Gas within the subcutaneous soft tissues of the lower anterior abdominal wall bilaterally, presumably iatrogenic. There are no aggressive appearing lytic or blastic lesions noted in the visualized portions of the skeleton.  Review of the MIP images confirms the above findings.  IMPRESSION: 1.  No acute findings in the abdomen or pelvis to account for the patient's symptoms. 2.  However, there is splenomegaly, as above. 3.  Atherosclerosis. 4.  Additional incidental findings, as above.   Original Report Authenticated By: Trudie Reed, M.D.    ASSESSMENT:  1. Acute coronary syndrome with severe three-vessel coronary disease but not an ideal candidate for surgical revascularization because of diffuse disease in the LAD.  Culprit circumflex angioplasty planned for tomorrow.  2. Diabetes with poor control  3. Splenomegaly with thrombocytopenia - hematology recommends followup with serial ultrasounds  4. Left upper quadrant pain when supine, presumed musculoskeletal. Clearly not ischemic.  Plan:  1. Will continue medical therapy as we discussed the appropriate approach to ischemic heart disease. Culprit circumflex PCI tomorrow 2. Left upper quadrant pain of uncertain etiology. It  is significant only when he is lying flat as on the catheter table. CT scan remarkable only for splenomegaly. Appreciate hematology input - pain felt musculoskeletal in origin and not related to splenomegaly  3. Continue low molecular weight heparin, beta blocker therapy, aspirin, and IV nitroglycerin.       Quintella Reichert, MD  09/05/2012  7:57 AM

## 2012-09-05 NOTE — Progress Notes (Signed)
ANTICOAGULATION CONSULT NOTE - Follow Up Consult  Pharmacy Consult for lovenox Indication: chest pain/ACS  No Known Allergies  Patient Measurements: Height: 5\' 10"  (177.8 cm) Weight: 261 lb 7.5 oz (118.6 kg) IBW/kg (Calculated) : 73  Vital Signs: Temp: 98.1 F (36.7 C) (06/22 1210) Temp src: Oral (06/22 1210) BP: 133/61 mmHg (06/22 1210) Pulse Rate: 57 (06/22 1210)  Labs:  Recent Labs  09/03/12 1357 09/05/12 0355  HGB  --  14.7  HCT  --  41.9  PLT 161 139*  APTT 37  --   LABPROT 13.2  --   INR 1.01  --   CREATININE  --  0.74    Estimated Creatinine Clearance: 144.1 ml/min (by C-G formula based on Cr of 0.74).   Medical History: Past Medical History  Diagnosis Date  . Diabetes mellitus without complication   . Hypertension   . Hypercholesterolemia     Medications:  Prescriptions prior to admission  Medication Sig Dispense Refill  . aspirin EC 81 MG tablet Take 81 mg by mouth daily.      . cyclobenzaprine (FLEXERIL) 10 MG tablet Take 1 tablet (10 mg total) by mouth 2 (two) times daily as needed for muscle spasms.  20 tablet  0  . esomeprazole (NEXIUM) 40 MG packet Take 40 mg by mouth at bedtime and may repeat dose one time if needed.      . insulin glargine (LANTUS) 100 UNIT/ML injection Inject 100 Units into the skin at bedtime.      . insulin lispro (HUMALOG) 100 UNIT/ML injection Inject 40 Units into the skin 3 (three) times daily before meals.      . insulin lispro (HUMALOG) 100 UNIT/ML injection Inject 1-20 Units into the skin See admin instructions. Sliding scale insulin tid with meals. Takes one unit for every 10 above 100 cbg.      . naproxen sodium (ANAPROX) 220 MG tablet Take 440 mg by mouth every evening. For foot pain      . omega-3 acid ethyl esters (LOVAZA) 1 G capsule Take 4 g by mouth at bedtime and may repeat dose one time if needed.      . Simethicone (GAS-X PO) Take 1 tablet by mouth every 4 (four) hours as needed (for indigestion).       .  simvastatin (ZOCOR) 40 MG tablet Take 40 mg by mouth daily.      . Tadalafil (CIALIS PO) Take 1 tablet by mouth daily as needed (erectile dysfunction).      Marland Kitchen telmisartan (MICARDIS) 40 MG tablet Take 40 mg by mouth daily.        Assessment: 51 yom admitted 09/01/2012  with chest pain. Pharmacy consulted to restart Lovenox after diagnostic catheterization pending further decisions on cardiac interventions.  CBC stable today, no bleeding or complications noted.  Planning for cath lab tomorrow.  Goal of Therapy:  Anti-Xa level 0.6-1.2 units/ml 4hrs after LMWH dose given Monitor platelets by anticoagulation protocol: Yes   Plan:  1. Lovenox 120mg  SQ Q12H 2. CBC Q72H while on lovenox 3. Follow up plans for anticoagulation after cath lab tomorrow.  Thank you for allowing pharmacy to be a part of this patients care team.  Tad Moore, BCPS  Clinical Pharmacist Pager [phone]  09/05/2012 12:57 PM

## 2012-09-06 ENCOUNTER — Encounter (HOSPITAL_COMMUNITY)
Admission: AD | Disposition: A | Discharge status: Home / Self Care | Payer: Self-pay | Source: Home / Self Care | Attending: Interventional Cardiology

## 2012-09-06 ENCOUNTER — Encounter (HOSPITAL_COMMUNITY): Payer: BC Managed Care – PPO

## 2012-09-06 HISTORY — PX: PERCUTANEOUS CORONARY STENT INTERVENTION (PCI-S): SHX5485

## 2012-09-06 LAB — GLUCOSE, CAPILLARY
Glucose-Capillary: 158 mg/dL — ABNORMAL HIGH (ref 70–99)
Glucose-Capillary: 168 mg/dL — ABNORMAL HIGH (ref 70–99)
Glucose-Capillary: 130 mg/dL — ABNORMAL HIGH (ref 70–99)

## 2012-09-06 LAB — BASIC METABOLIC PANEL
BUN: 12 mg/dL (ref 6–23)
GFR calc Af Amer: >90 mL/min (ref >90)
GFR calc non Af Amer: >90 mL/min (ref >90)
Potassium: 4.7 mEq/L (ref 3.5–5.1)
Sodium: 134 mEq/L — ABNORMAL LOW (ref 135–145)

## 2012-09-06 LAB — POCT ACTIVATED CLOTTING TIME: Activated Clotting Time: 529 seconds

## 2012-09-06 SURGERY — PERCUTANEOUS CORONARY STENT INTERVENTION (PCI-S)
Anesthesia: LOCAL

## 2012-09-06 MED ORDER — SODIUM CHLORIDE 0.9 % IV SOLN: INTRAVENOUS | Status: AC

## 2012-09-06 MED ORDER — TICAGRELOR 90 MG PO TABS
ORAL_TABLET | ORAL | Status: AC
  Filled 2012-09-06: qty 1

## 2012-09-06 MED ORDER — HEPARIN (PORCINE) IN NACL 2-0.9 UNIT/ML-% IJ SOLN
INTRAMUSCULAR | Status: AC
  Filled 2012-09-06: qty 1000

## 2012-09-06 MED ORDER — TICAGRELOR 90 MG PO TABS
90.0000 mg | ORAL_TABLET | Freq: Two times a day (BID) | ORAL | Status: DC
  Administered 2012-09-06 – 2012-09-07 (×2): 90 mg via ORAL
  Filled 2012-09-06 (×3): qty 1

## 2012-09-06 MED ORDER — ONDANSETRON HCL 4 MG/2ML IJ SOLN: 4.0000 mg | Freq: Four times a day (QID) | INTRAMUSCULAR | Status: DC | PRN

## 2012-09-06 MED ORDER — TICAGRELOR 90 MG PO TABS
90.0000 mg | ORAL_TABLET | Freq: Once | ORAL | Status: DC
  Filled 2012-09-06: qty 1

## 2012-09-06 MED ORDER — LIVING WELL WITH DIABETES BOOK
Freq: Once | Status: AC
  Administered 2012-09-06: 23:00:00
  Filled 2012-09-06: qty 1

## 2012-09-06 MED ORDER — BIVALIRUDIN 250 MG IV SOLR
INTRAVENOUS | Status: AC
  Filled 2012-09-06: qty 250

## 2012-09-06 MED ORDER — ACTIVE PARTNERSHIP FOR HEALTH OF YOUR HEART BOOK
Freq: Once | Status: AC
  Administered 2012-09-06: 23:00:00
  Filled 2012-09-06: qty 1

## 2012-09-06 MED ORDER — ALUM & MAG HYDROXIDE-SIMETH 200-200-20 MG/5ML PO SUSP
30.0000 mL | ORAL | Status: DC | PRN
  Administered 2012-09-06: 30 mL via ORAL
  Filled 2012-09-06: qty 30

## 2012-09-06 MED ORDER — FENTANYL CITRATE 0.05 MG/ML IJ SOLN
INTRAMUSCULAR | Status: AC
  Filled 2012-09-06: qty 2

## 2012-09-06 MED ORDER — LIDOCAINE HCL (PF) 1 % IJ SOLN
INTRAMUSCULAR | Status: AC
  Filled 2012-09-06: qty 30

## 2012-09-06 MED ORDER — NITROGLYCERIN IN D5W 200-5 MCG/ML-% IV SOLN
3.0000 ug/min | INTRAVENOUS | Status: AC
  Filled 2012-09-06: qty 250

## 2012-09-06 MED ORDER — ZOLPIDEM TARTRATE 5 MG PO TABS
5.0000 mg | ORAL_TABLET | Freq: Once | ORAL | Status: AC
  Administered 2012-09-06: 5 mg via ORAL
  Filled 2012-09-06: qty 1

## 2012-09-06 MED ORDER — MIDAZOLAM HCL 2 MG/2ML IJ SOLN
INTRAMUSCULAR | Status: AC
  Filled 2012-09-06: qty 2

## 2012-09-06 MED ORDER — NITROGLYCERIN 0.2 MG/ML ON CALL CATH LAB
INTRAVENOUS | Status: AC
  Filled 2012-09-06: qty 1

## 2012-09-06 NOTE — CV Procedure (Signed)
   PERCUTANEOUS CORONARY INTERVENTION   Adi Doro is a 52 y.o. male  INDICATION: ACS   PROCEDURE: Culprit lesion angioplasty and DES stent implantation mid circumflex   CONSENT: The risks, benefits, and details of the procedure were explained to the patient. Risks including death, MI, stroke, bleeding, limb ischemia, renal failure and allergy were described and accepted by the patient.  Informed written consent was obtained prior to proceeding.  PROCEDURE TECHNIQUE:  After Xylocaine anesthesia a 6 French sheath was placed in the right femoral artery with an anterior needle wall stick.  The initial stick did not allow easy passage of the guidewire. Coronary guiding shots were made using a 6 French 3.5 CLS guide catheter. Antithrombotic therapy, bivalirudin bolus and infusion, was begun and determined to be therapeutic by ACT. Antiplatelet therapy, Brilinta, was loaded 12 hours prior to the procedure and another 90 mg was given this morning.  We used a BMW wire across the stenosis in the circumflex. Predilatation using a 20 mm long by 3.0 mm Trek was performed throughout the mid vessel. We then positioned and deployed a 38 mm long by 3.0 mm diameter Promus Premier DES to 14 atmospheres. We then post dilated with a 3.25 x 20 mm [place] tract to 16 atmospheres throughout the stented segment. The post procedure angiographic results were acceptable.  CONTRAST:  Total of 120 cc.  COMPLICATIONS:  Small right groin hematoma.    ANGIOGRAPHIC RESULTS:   Segmental stenosis with sequential 70, 99 and 50% stenoses. Post stenting 0% stenosis was noted throughout the treated region. TIMI grade 3 flow was noted. No discomfort during PCI.   IMPRESSIONS:  Successful stenting of the MID circumflex from 99% to 0% with TIMI grade 3 flow to   RECOMMENDATION:  Discontinue Lovenox, Angiomax, wean and DC nitroglycerin,. Will go to 6500 and plan on discharge tomorrow a.m.    Lesleigh Noe,  MD 09/06/2012 10:00 AM

## 2012-09-06 NOTE — Care Management Note (Signed)
    Page 1 of 1   09/06/2012     4:00:08 PM   CARE MANAGEMENT NOTE 09/06/2012  Patient:  Maurice Johnston, Maurice Johnston   Account Number:  [id number]  Date Initiated:  09/01/2012  Documentation initiated by:  Alvira Philips Assessment:   52 yr-old male adm with dx of [place]; lives with spouse     Action/Plan:   Benefits check   Anticipated DC Date:  09/06/2012   Anticipated DC Plan:  HOME/SELF CARE      DC Planning Services  CM consult      Choice offered to / List presented to:             Status of service:   Medicare Important Message given?   (If response is "NO", the following Medicare IM given date fields will be blank) Date Medicare IM given:   Date Additional Medicare IM given:    Discharge Disposition:    Per UR Regulation:  Reviewed for med. necessity/level of care/duration of stay  If discussed at Long Length of Stay Meetings, dates discussed:    Comments:  PCP:  Dr [name] ---09/06/2012 1045 by Mardene Sayer--- S/W DANTE @ [employer]-  # [phone]     BRILINTA --90 MG BID    COVER-YES    TIER-3 DRUG     CO-PAY-$ 16.00     PRIOR APPROVAL -NO     PHARMACY- ANY

## 2012-09-06 NOTE — H&P (Signed)
We havew decided upon Cfx culprit PCI. Will need long stent in the vessel. Discussed with patient and family. Will perform via right femoral as this gives the quickest and most reliable technique to avoid prolonged supine time which led to severe back pain while on cath table. Back pain was so severe we had to stop procedure earlier than planned.  The patient had rest pain yesterday that required up titration of NTG to control. Currently pain free.  We have discussed the plan for PCI and treatment alternatives including CABG. Since LAD is not currently amenable to surgery, we have chosen PCI as a temporizing measure to avoid surgery at such a young age.  The risk of stroke, death, MI, Bleeding, limb ischemia, kidney injury, CABG, among others were re-discussed and accepted by the patient.Cath Lab Visit (complete for each Cath Lab visit)  Clinical Evaluation Leading to the Procedure:   ACS: yes  Non-ACS:    Anginal Classification: CCS IV  Anti-ischemic medical therapy: Maximal Therapy (2 or more classes of medications)  Non-Invasive Test Results: No non-invasive testing performed  Prior CABG: No previous CABG

## 2012-09-07 DIAGNOSIS — D696 Thrombocytopenia, unspecified: Secondary | ICD-10-CM | POA: Diagnosis present

## 2012-09-07 DIAGNOSIS — R161 Splenomegaly, not elsewhere classified: Secondary | ICD-10-CM | POA: Diagnosis present

## 2012-09-07 LAB — BASIC METABOLIC PANEL
CO2: 23 mEq/L (ref 19–32)
GFR calc non Af Amer: >90 mL/min (ref >90)
Glucose, Bld: 121 mg/dL — ABNORMAL HIGH (ref 70–99)
Potassium: 3.9 mEq/L (ref 3.5–5.1)
Sodium: 134 mEq/L — ABNORMAL LOW (ref 135–145)

## 2012-09-07 LAB — CBC
Hemoglobin: 15.5 g/dL (ref 13.0–17.0)
RBC: 5.33 MIL/uL (ref 4.22–5.81)

## 2012-09-07 LAB — GLUCOSE, CAPILLARY: Glucose-Capillary: 129 mg/dL — ABNORMAL HIGH (ref 70–99)

## 2012-09-07 MED ORDER — TICAGRELOR 90 MG PO TABS: 90.0000 mg | ORAL_TABLET | Freq: Two times a day (BID) | ORAL | Status: DC

## 2012-09-07 MED ORDER — METOPROLOL SUCCINATE ER 50 MG PO TB24
50.0000 mg | ORAL_TABLET | Freq: Every day | ORAL | Status: DC
  Administered 2012-09-07: 50 mg via ORAL
  Filled 2012-09-07: qty 1

## 2012-09-07 MED ORDER — METOPROLOL SUCCINATE ER 50 MG PO TB24: 50.0000 mg | ORAL_TABLET | Freq: Every day | ORAL | Status: DC

## 2012-09-07 MED ORDER — NITROGLYCERIN 0.4 MG SL SUBL: 0.4000 mg | SUBLINGUAL_TABLET | SUBLINGUAL | Status: DC | PRN

## 2012-09-07 MED FILL — Dextrose Inj 5%: INTRAVENOUS | Qty: 1000 | Status: AC

## 2012-09-07 NOTE — Progress Notes (Signed)
CARDIAC REHAB PHASE I   PRE:  Rate/Rhythm: 76 SR with PVC    BP: sitting 128/57    SaO2:   MODE:  Ambulation: 500 ft   POST:  Rate/Rhythm: 84 SR     BP: sitting 149/77     SaO2:   Pt had supposed angina in left lower quadrant about 1/2 way through walk. Sts "it wasn't too bad", did not radiate to chest. Resolved with rest after walk (in recliner). BP increased after walk. Ed completed focusing on ex and diet control. Pt interested in [facility] and will send referral to [facility].  [id number]   Maurice Johnston CES, ACSM 09/07/2012 9:01 AM

## 2012-09-07 NOTE — Discharge Summary (Signed)
Patient ID: Maurice Johnston MRN: [id number] DOB/AGE: [birth year] 52 y.o.  Admit date: 09/01/2012 Discharge date: 09/07/2012  Primary Discharge Diagnosis:  Unstable angina pectoris Secondary Discharge Diagnosis: Coronary atherosclerotic heart disease documented by catheterization  Diabetes mellitus, poorly controlled  Hypertension  Hyperlipidemia  Left lower chest/left upper quadrant pain worse when lying flat, probably musculoskeletal  Splenomegaly  Thrombocytopenia  Patient Active Problem List   Diagnosis Date Noted  . Unstable angina pectoris 09/01/2012    Priority: High    Class: Acute  . Pleuritic chest pain 09/02/2012    Priority: Medium    Class: Acute  . 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 Essential hypertension 09/01/2012    Priority: Medium    Class: Chronic  . Splenomegaly 09/07/2012    Priority: Low    Class: Chronic  . Thrombocytopenia 09/07/2012    Priority: Low    Class: Chronic  . Morbid obesity 09/01/2012    Priority: Low  . Hyperlipidemia LDL goal < 70 09/01/2012    Priority: Low    Class: Chronic    Significant Diagnostic Studies: 1. Coronary angiography; 2. CT angiography chest and abdomen; 3. PCI circumflex; 4. 2-D echocardiogram  Consults: 1. Sheliah Plane M.D., cardiac surgery; 2. Kimberlee Nearing, M.D., hematology oncology  Hospital Course: The patient was admitted to the hospital on 09/01/12 with a clinical history compatible with unstable angina pectoris. He underwent coronary angiography which revealed severe three-vessel coronary artery disease in a bath use and multifocal diabetic pattern. Additionally, the catheterization was performed on the circumstances that were suboptimal because the patient was unable to lie flat due to severe left chest and upper quadrant pain. The discomfort improved with getting off the cath table.  He was  seen in consultation by Dr. Tyrone Sage to consider surgical options. The problem is that LAD disease is diffuse and distal. At least one of the severely involved marginal branches was also not bypassable. I felt that the circumflex was the patient's culprit vessel and after discussion including the patient, we decided to proceed with culprit vessel PCI on 09/06/12. The patient received a 38 mm 3.0 DES to the mid circumflex postdilated to 3.25 mm in diameter.  Because of left upper quadrant pain, splenomegaly identified by CT, and thrombocytopenia, a hematology oncology consultation was obtained with Dr. Arline Asp. He did not feel that the left upper quadrant pain was related to the spleen. We were uncertain if the thrombocytopenia, which was very mild, the spleen related. This was felt to be a clinical problem that could be monitored over time.  On the morning of discharge, the patient ambulated without central chest discomfort. He did have left lower chest/left upper quadrant discomfort with ambulation.   Discharge Exam: Blood pressure 128/57, pulse 73, temperature 98.3 F (36.8 C), temperature source Oral, resp. rate 20, height 5\' 10"  (1.778 m), weight 118.3 kg (260 lb 12.9 oz), SpO2 96.00%.   The right lower extremity cath site is unremarkable. The radial cath site is unremarkable.  The abdomen is nontender. There is no left upper quadrant tenderness.  The cardiac exam reveals no rub or murmur.  Labs:   Lab Results  Component Value Date   WBC 9.8 09/07/2012   HGB 15.5 09/07/2012   HCT 43.6 09/07/2012   MCV 81.8 09/07/2012   PLT 142* 09/07/2012    Recent Labs Lab 09/01/12 1430  09/07/12 0445  NA 137  < > 134*  K 3.7  < > 3.9  CL 102  < > 100  CO2 23  < > 23  BUN 19  < > 14  CREATININE 0.83  < > 0.70  CALCIUM 9.3  < > 9.1  PROT 7.1  --   --   BILITOT 0.6  --   --   ALKPHOS 66  --   --   ALT 49  --   --   AST 31  --   --   GLUCOSE 83  < > 121*  < > = values in this interval not  displayed. Lab Results  Component Value Date   TROPONINI <0.30 09/02/2012    Lab Results  Component Value Date   CHOL 140 09/02/2012   Lab Results  Component Value Date   HDL 30* 09/02/2012   Lab Results  Component Value Date   LDLCALC 40 09/02/2012   Lab Results  Component Value Date   TRIG 350* 09/02/2012   Lab Results  Component Value Date   CHOLHDL 4.7 09/02/2012   No results found for this basename: LDLDIRECT      Radiology:  CT angiogram chest and abdomen: Splenomegaly  EKG: Normal  FOLLOW UP PLANS AND APPOINTMENTS    Medication List    STOP taking these medications       CIALIS PO     naproxen sodium 220 MG tablet  Commonly known as:  ANAPROX      TAKE these medications       aspirin EC 81 MG tablet  Take 81 mg by mouth daily.     cyclobenzaprine 10 MG tablet  Commonly known as:  FLEXERIL  Take 1 tablet (10 mg total) by mouth 2 (two) times daily as needed for muscle spasms.     esomeprazole 40 MG packet  Commonly known as:  NEXIUM  Take 40 mg by mouth at bedtime and may repeat dose one time if needed.     GAS-X PO  Take 1 tablet by mouth every 4 (four) hours as needed (for indigestion).     insulin glargine 100 UNIT/ML injection  Commonly known as:  LANTUS  Inject 100 Units into the skin at bedtime.     insulin lispro 100 UNIT/ML injection  Commonly known as:  HUMALOG  Inject 40 Units into the skin 3 (three) times daily before meals.     insulin lispro 100 UNIT/ML injection  Commonly known as:  HUMALOG  Inject 1-20 Units into the skin See admin instructions. Sliding scale insulin tid with meals. Takes one unit for every 10 above 100 cbg.     metoprolol succinate 50 MG 24 hr tablet  Commonly known as:  TOPROL-XL  Take 1 tablet (50 mg total) by mouth daily. Take with or immediately following a meal.     nitroGLYCERIN 0.4 MG SL tablet  Commonly known as:  NITROSTAT  Place 1 tablet (0.4 mg total) under the tongue every 5 (five) minutes x 3  doses as needed for chest pain.     omega-3 acid ethyl esters 1 G capsule  Commonly known as:  LOVAZA  Take 4 g by mouth at bedtime and may repeat dose one time if needed.     simvastatin 40 MG tablet  Commonly known as:  ZOCOR  Take 40 mg by mouth daily.     telmisartan 40 MG tablet  Commonly known as:  MICARDIS  Take 40 mg by mouth  daily.     Ticagrelor 90 MG Tabs tablet  Commonly known as:  BRILINTA  Take 1 tablet (90 mg total) by mouth 2 (two) times daily.           Follow-up Information   Follow up with Lesleigh Noe, MD On 09/24/2012. (930A)    Contact information:   [address] [place] [address] [phone]       BRING ALL MEDICATIONS WITH YOU TO FOLLOW UP APPOINTMENTS  Time spent with patient to include physician time: 30 minutes Signed: Lesleigh Noe 09/07/2012, 8:25 AM

## 2012-10-14 ENCOUNTER — Encounter (HOSPITAL_COMMUNITY)
Admission: RE | Admit: 2012-10-14 | Discharge: 2012-10-14 | Disposition: A | Discharge status: Home / Self Care | Payer: BC Managed Care – PPO | Source: Ambulatory Visit | Attending: Interventional Cardiology | Admitting: Interventional Cardiology

## 2012-10-14 NOTE — Progress Notes (Signed)
Cardiac Rehab Medication Review by a Pharmacist  Does the patient  feel that his/her medications are working for him/her?  yes  Has the patient been experiencing any side effects to the medications prescribed?  no  Does the patient measure his/her own blood pressure or blood glucose at home?  yes   Does the patient have any problems obtaining medications due to transportation or finances?   no  Understanding of regimen: good Understanding of indications: good Potential of compliance: good  Pharmacist comments: 52 yo male presents without list of his medications but says he knows what he is taking, was able to list all of his medications with little difficulty. Monitors blood sugars at home but not blood pressure. He says he will buy one today because his BP was low this morning. Says he rarely misses doses except for one or two instances with his insulin but otherwise doing well with medications, no significant side effects.  Maurice Johnston. Artelia Laroche, PharmD Clinical Pharmacist - Resident Pager: [phone] Phone: [phone] 10/14/2012 8:43 AM

## 2012-10-18 ENCOUNTER — Encounter (HOSPITAL_COMMUNITY)
Admission: RE | Admit: 2012-10-18 | Discharge: 2012-10-18 | Disposition: A | Discharge status: Home / Self Care | Payer: BC Managed Care – PPO | Source: Ambulatory Visit | Attending: Interventional Cardiology | Admitting: Interventional Cardiology

## 2012-10-18 ENCOUNTER — Encounter (HOSPITAL_COMMUNITY): Payer: Self-pay

## 2012-10-18 ENCOUNTER — Encounter (HOSPITAL_COMMUNITY): Payer: BC Managed Care – PPO

## 2012-10-18 DIAGNOSIS — Z5189 Encounter for other specified aftercare: Secondary | ICD-10-CM | POA: Insufficient documentation

## 2012-10-18 DIAGNOSIS — Z9861 Coronary angioplasty status: Secondary | ICD-10-CM | POA: Insufficient documentation

## 2012-10-18 DIAGNOSIS — I2510 Atherosclerotic heart disease of native coronary artery without angina pectoris: Secondary | ICD-10-CM | POA: Insufficient documentation

## 2012-10-18 NOTE — Progress Notes (Signed)
Pt started cardiac rehab today.  Pt tolerated light exercise without difficulty.  VSS, telemetry-Sinus rhythm frequent PVC.  Rhythm strip faxed to MD for review.  PHQ score-zero.  Pt has no psychosocial barriers to participating in cardiac rehab.  Pt did not bring back his homework packet.  He states he will return on next exercise session. Pt oriented to exercise equipment and routine.  Understanding verbalized.

## 2012-10-20 ENCOUNTER — Encounter (HOSPITAL_COMMUNITY): Payer: BC Managed Care – PPO

## 2012-10-20 ENCOUNTER — Encounter (HOSPITAL_COMMUNITY)
Admission: RE | Admit: 2012-10-20 | Discharge: 2012-10-20 | Disposition: A | Discharge status: Home / Self Care | Payer: BC Managed Care – PPO | Source: Ambulatory Visit | Attending: Interventional Cardiology | Admitting: Interventional Cardiology

## 2012-10-20 LAB — GLUCOSE, CAPILLARY: Glucose-Capillary: 200 mg/dL — ABNORMAL HIGH (ref 70–99)

## 2012-10-20 NOTE — Progress Notes (Signed)
PSYCHOSOCIAL ASSESSMENT  Pt psychosocial assessment reveals no barriers to rehab participation.  Pt quality of life is slightly altered by his recent cardiac event.  He is concerned about his abilities due to residual disease.  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  He prefers and enjoys the hands on cooking role.  Overall,  Pt exhibits positive coping skills and has supportive family.  Offered emotional support and reassurance.  Will continue to monitor.

## 2012-10-22 ENCOUNTER — Encounter (HOSPITAL_COMMUNITY)
Admission: RE | Admit: 2012-10-22 | Discharge: 2012-10-22 | Disposition: A | Discharge status: Home / Self Care | Payer: BC Managed Care – PPO | Source: Ambulatory Visit | Attending: Interventional Cardiology | Admitting: Interventional Cardiology

## 2012-10-22 ENCOUNTER — Encounter (HOSPITAL_COMMUNITY): Payer: BC Managed Care – PPO

## 2012-10-22 LAB — GLUCOSE, CAPILLARY: Glucose-Capillary: 131 mg/dL — ABNORMAL HIGH (ref 70–99)

## 2012-10-22 NOTE — Progress Notes (Addendum)
Reviewed home exercise with pt today.  Pt plans to exercise at the [facility] for exercise.  He will be walking on the treadmill and doing hand weights for 30-45 minutes, 2-4 days outside of Cardiac Rehab. Reviewed THR, pulse, RPE, sign and symptoms, NTG use, and when to call 911 or MD.  Pt voiced understanding.  Alexia Freestone, MS, ACSM RCEP 8:10 AM

## 2012-10-25 ENCOUNTER — Encounter (HOSPITAL_COMMUNITY)
Admission: RE | Admit: 2012-10-25 | Discharge: 2012-10-25 | Disposition: A | Discharge status: Home / Self Care | Payer: BC Managed Care – PPO | Source: Ambulatory Visit | Attending: Interventional Cardiology | Admitting: Interventional Cardiology

## 2012-10-25 ENCOUNTER — Encounter (HOSPITAL_COMMUNITY): Payer: BC Managed Care – PPO

## 2012-10-27 ENCOUNTER — Encounter (HOSPITAL_COMMUNITY): Payer: BC Managed Care – PPO

## 2012-10-27 ENCOUNTER — Encounter (HOSPITAL_COMMUNITY)
Admission: RE | Admit: 2012-10-27 | Discharge: 2012-10-27 | Disposition: A | Discharge status: Home / Self Care | Payer: BC Managed Care – PPO | Source: Ambulatory Visit | Attending: Interventional Cardiology | Admitting: Interventional Cardiology

## 2012-10-29 ENCOUNTER — Encounter (HOSPITAL_COMMUNITY): Payer: BC Managed Care – PPO

## 2012-10-29 ENCOUNTER — Encounter (HOSPITAL_COMMUNITY)
Admission: RE | Admit: 2012-10-29 | Discharge: 2012-10-29 | Disposition: A | Discharge status: Home / Self Care | Payer: BC Managed Care – PPO | Source: Ambulatory Visit | Attending: Interventional Cardiology | Admitting: Interventional Cardiology

## 2012-11-01 ENCOUNTER — Encounter (HOSPITAL_COMMUNITY)
Admission: RE | Admit: 2012-11-01 | Discharge: 2012-11-01 | Disposition: A | Discharge status: Home / Self Care | Payer: BC Managed Care – PPO | Source: Ambulatory Visit | Attending: Interventional Cardiology | Admitting: Interventional Cardiology

## 2012-11-01 ENCOUNTER — Encounter (HOSPITAL_COMMUNITY): Payer: BC Managed Care – PPO

## 2012-11-01 NOTE — Progress Notes (Signed)
Maurice Johnston 52 y.o. male Nutrition Note Spoke with pt.  Nutrition Survey goals reviewed with pt. Pt is not following the Therapeutic Lifestyle Changes diet. Pt reports making "some" changes prior to his heart event because his wife had gastric bypass. The importance of lifestyle changes with heart disease emphasized. 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 Pt's job requires him to travel "2-3 hours some days." Ways to incorporate healthier eating while traveling discussed. Pt expressed understanding of the information reviewed. Pt aware of nutrition education classes offered and plans on attending nutrition classes.  Nutrition Diagnosis   Food-and nutrition-related knowledge deficit related to lack of exposure to information as related to diagnosis of: ? CVD ? DM (A1c 7.6)   Obesity related to excessive energy intake as evidenced by a BMI of 38.9  Nutrition RX/ Estimated Daily Nutrition Needs for: wt loss  1850-2350 Kcal, 50-65 gm fat, 12-16 gm sat fat, 1.8-2.4 gm trans-fat, <1500 mg sodium, 250-325 gm CHO   Nutrition Intervention   Benefits of adopting Therapeutic Lifestyle Changes discussed when Medficts reviewed.   Pt to attend the Portion Distortion class   Pt to attend the  ? Nutrition I class                     ? Nutrition II class        ? Diabetes Blitz class       ? Diabetes Q & A class   Continue client-centered nutrition education by RD, as part of interdisciplinary care. Goal(s)   Pt to identify and limit food sources of saturated fat, trans fat, and cholesterol   Pt to identify food quantities necessary to achieve: ? wt loss to a goal wt of 238-256 lb (108.6-116.8 kg) at graduation from cardiac rehab.    CBG concentrations in the normal range or as close to normal as is safely possible. Monitor and Evaluate progress toward nutrition goal with team. Nutrition Risk: High   Mickle Plumb, M.Ed, RD, LDN, CDE 11/01/2012 9:07 AM

## 2012-11-03 ENCOUNTER — Encounter (HOSPITAL_COMMUNITY): Payer: BC Managed Care – PPO

## 2012-11-03 ENCOUNTER — Encounter (HOSPITAL_COMMUNITY)
Admission: RE | Admit: 2012-11-03 | Discharge: 2012-11-03 | Disposition: A | Discharge status: Home / Self Care | Payer: BC Managed Care – PPO | Source: Ambulatory Visit | Attending: Interventional Cardiology | Admitting: Interventional Cardiology

## 2012-11-05 ENCOUNTER — Encounter (HOSPITAL_COMMUNITY): Payer: BC Managed Care – PPO

## 2012-11-05 ENCOUNTER — Encounter (HOSPITAL_COMMUNITY)
Admission: RE | Admit: 2012-11-05 | Discharge: 2012-11-05 | Disposition: A | Discharge status: Home / Self Care | Payer: BC Managed Care – PPO | Source: Ambulatory Visit | Attending: Interventional Cardiology | Admitting: Interventional Cardiology

## 2012-11-08 ENCOUNTER — Encounter (HOSPITAL_COMMUNITY): Payer: BC Managed Care – PPO

## 2012-11-08 ENCOUNTER — Encounter (HOSPITAL_COMMUNITY)
Admission: RE | Admit: 2012-11-08 | Discharge: 2012-11-08 | Disposition: A | Discharge status: Home / Self Care | Payer: BC Managed Care – PPO | Source: Ambulatory Visit | Attending: Interventional Cardiology | Admitting: Interventional Cardiology

## 2012-11-10 ENCOUNTER — Encounter (HOSPITAL_COMMUNITY): Payer: BC Managed Care – PPO

## 2012-11-10 ENCOUNTER — Encounter (HOSPITAL_COMMUNITY)
Admission: RE | Admit: 2012-11-10 | Discharge: 2012-11-10 | Disposition: A | Discharge status: Home / Self Care | Payer: BC Managed Care – PPO | Source: Ambulatory Visit | Attending: Interventional Cardiology | Admitting: Interventional Cardiology

## 2012-11-12 ENCOUNTER — Encounter (HOSPITAL_COMMUNITY)
Admission: RE | Admit: 2012-11-12 | Discharge: 2012-11-12 | Disposition: A | Discharge status: Home / Self Care | Payer: BC Managed Care – PPO | Source: Ambulatory Visit | Attending: Interventional Cardiology | Admitting: Interventional Cardiology

## 2012-11-12 ENCOUNTER — Encounter (HOSPITAL_COMMUNITY): Payer: BC Managed Care – PPO

## 2012-11-15 ENCOUNTER — Encounter (HOSPITAL_COMMUNITY): Payer: BC Managed Care – PPO

## 2012-11-17 ENCOUNTER — Encounter (HOSPITAL_COMMUNITY): Payer: BC Managed Care – PPO

## 2012-11-19 ENCOUNTER — Encounter (HOSPITAL_COMMUNITY)
Admission: RE | Admit: 2012-11-19 | Discharge: 2012-11-19 | Disposition: A | Discharge status: Home / Self Care | Payer: BC Managed Care – PPO | Source: Ambulatory Visit | Attending: Interventional Cardiology | Admitting: Interventional Cardiology

## 2012-11-19 ENCOUNTER — Encounter (HOSPITAL_COMMUNITY): Payer: BC Managed Care – PPO

## 2012-11-19 DIAGNOSIS — Z9861 Coronary angioplasty status: Secondary | ICD-10-CM | POA: Insufficient documentation

## 2012-11-19 DIAGNOSIS — I2510 Atherosclerotic heart disease of native coronary artery without angina pectoris: Secondary | ICD-10-CM | POA: Insufficient documentation

## 2012-11-19 DIAGNOSIS — Z5189 Encounter for other specified aftercare: Secondary | ICD-10-CM | POA: Insufficient documentation

## 2012-11-22 ENCOUNTER — Encounter (HOSPITAL_COMMUNITY): Payer: BC Managed Care – PPO

## 2012-11-24 ENCOUNTER — Encounter (HOSPITAL_COMMUNITY): Payer: BC Managed Care – PPO

## 2012-11-26 ENCOUNTER — Encounter (HOSPITAL_COMMUNITY)
Admission: RE | Admit: 2012-11-26 | Discharge: 2012-11-26 | Disposition: A | Discharge status: Home / Self Care | Payer: BC Managed Care – PPO | Source: Ambulatory Visit | Attending: Interventional Cardiology | Admitting: Interventional Cardiology

## 2012-11-26 ENCOUNTER — Encounter (HOSPITAL_COMMUNITY): Payer: BC Managed Care – PPO

## 2012-11-29 ENCOUNTER — Encounter (HOSPITAL_COMMUNITY): Payer: BC Managed Care – PPO

## 2012-11-29 ENCOUNTER — Encounter (HOSPITAL_COMMUNITY)
Admission: RE | Admit: 2012-11-29 | Discharge: 2012-11-29 | Disposition: A | Discharge status: Home / Self Care | Payer: BC Managed Care – PPO | Source: Ambulatory Visit | Attending: Interventional Cardiology | Admitting: Interventional Cardiology

## 2012-12-01 ENCOUNTER — Encounter (HOSPITAL_COMMUNITY): Payer: BC Managed Care – PPO

## 2012-12-01 ENCOUNTER — Encounter (HOSPITAL_COMMUNITY)
Admission: RE | Admit: 2012-12-01 | Discharge: 2012-12-01 | Disposition: A | Discharge status: Home / Self Care | Payer: BC Managed Care – PPO | Source: Ambulatory Visit | Attending: Interventional Cardiology | Admitting: Interventional Cardiology

## 2012-12-03 ENCOUNTER — Encounter (HOSPITAL_COMMUNITY): Payer: BC Managed Care – PPO

## 2012-12-06 ENCOUNTER — Encounter (HOSPITAL_COMMUNITY): Payer: BC Managed Care – PPO

## 2012-12-08 ENCOUNTER — Encounter (HOSPITAL_COMMUNITY): Payer: BC Managed Care – PPO

## 2012-12-10 ENCOUNTER — Encounter (HOSPITAL_COMMUNITY): Payer: BC Managed Care – PPO

## 2012-12-10 ENCOUNTER — Encounter (HOSPITAL_COMMUNITY)
Admission: RE | Admit: 2012-12-10 | Discharge: 2012-12-10 | Disposition: A | Discharge status: Home / Self Care | Payer: BC Managed Care – PPO | Source: Ambulatory Visit | Attending: Interventional Cardiology | Admitting: Interventional Cardiology

## 2012-12-10 NOTE — Progress Notes (Addendum)
Pt called on Monday morning to indicate he would not be at exercise that day or on Wednesday.   Pt expressed that he would like to graduate early on Friday.  Pt unable to continue participation in the program due to his work schedule.  Pt arrived to rehab on today and stated that he would not be able to stay for exercise. Pt turned in his parking badge.  Will plan to mail grad packet to pt for review.  Pt completed 16 exercise session in Cardiac rehab phase II. Alanson Aly, BSN

## 2012-12-13 ENCOUNTER — Encounter (HOSPITAL_COMMUNITY): Payer: BC Managed Care – PPO

## 2012-12-15 ENCOUNTER — Encounter (HOSPITAL_COMMUNITY): Payer: BC Managed Care – PPO

## 2012-12-17 ENCOUNTER — Encounter (HOSPITAL_COMMUNITY): Payer: BC Managed Care – PPO

## 2012-12-20 ENCOUNTER — Encounter (HOSPITAL_COMMUNITY): Payer: BC Managed Care – PPO

## 2012-12-22 ENCOUNTER — Encounter (HOSPITAL_COMMUNITY): Payer: BC Managed Care – PPO

## 2012-12-24 ENCOUNTER — Encounter (HOSPITAL_COMMUNITY): Payer: BC Managed Care – PPO

## 2012-12-27 ENCOUNTER — Encounter (HOSPITAL_COMMUNITY): Payer: BC Managed Care – PPO

## 2012-12-29 ENCOUNTER — Encounter (HOSPITAL_COMMUNITY): Payer: BC Managed Care – PPO

## 2012-12-31 ENCOUNTER — Encounter (HOSPITAL_COMMUNITY): Payer: BC Managed Care – PPO

## 2013-01-03 ENCOUNTER — Encounter (HOSPITAL_COMMUNITY): Payer: BC Managed Care – PPO

## 2013-01-05 ENCOUNTER — Encounter (HOSPITAL_COMMUNITY): Payer: BC Managed Care – PPO

## 2013-01-07 ENCOUNTER — Encounter (HOSPITAL_COMMUNITY): Payer: BC Managed Care – PPO

## 2013-01-10 ENCOUNTER — Encounter (HOSPITAL_COMMUNITY): Payer: BC Managed Care – PPO

## 2013-01-12 ENCOUNTER — Encounter (HOSPITAL_COMMUNITY): Payer: BC Managed Care – PPO

## 2013-01-14 ENCOUNTER — Encounter (HOSPITAL_COMMUNITY): Payer: BC Managed Care – PPO

## 2013-01-17 ENCOUNTER — Encounter (HOSPITAL_COMMUNITY): Payer: BC Managed Care – PPO

## 2013-01-19 ENCOUNTER — Encounter (HOSPITAL_COMMUNITY): Payer: BC Managed Care – PPO

## 2013-01-21 ENCOUNTER — Encounter (HOSPITAL_COMMUNITY): Payer: BC Managed Care – PPO

## 2013-02-05 ENCOUNTER — Encounter: Payer: Self-pay | Admitting: Interventional Cardiology

## 2013-02-05 ENCOUNTER — Encounter: Payer: Self-pay | Admitting: *Deleted

## 2013-02-08 ENCOUNTER — Encounter: Payer: Self-pay | Admitting: Interventional Cardiology

## 2013-02-08 ENCOUNTER — Ambulatory Visit (INDEPENDENT_AMBULATORY_CARE_PROVIDER_SITE_OTHER): Payer: BC Managed Care – PPO | Admitting: Interventional Cardiology

## 2013-02-08 VITALS — BP 138/78 | HR 70 | Ht 70.0 in | Wt 268.0 lb

## 2013-02-08 DIAGNOSIS — E1329 Other specified diabetes mellitus with other diabetic kidney complication: Secondary | ICD-10-CM

## 2013-02-08 DIAGNOSIS — I10 Essential (primary) hypertension: Secondary | ICD-10-CM

## 2013-02-08 DIAGNOSIS — I2510 Atherosclerotic heart disease of native coronary artery without angina pectoris: Secondary | ICD-10-CM

## 2013-02-08 DIAGNOSIS — N189 Chronic kidney disease, unspecified: Secondary | ICD-10-CM

## 2013-02-08 DIAGNOSIS — E0822 Diabetes mellitus due to underlying condition with diabetic chronic kidney disease: Secondary | ICD-10-CM

## 2013-02-08 DIAGNOSIS — E785 Hyperlipidemia, unspecified: Secondary | ICD-10-CM

## 2013-02-08 NOTE — Patient Instructions (Signed)
Your physician recommends that you continue on your current medications as directed. Please refer to the Current Medication list given to you today.  Your physician wants you to follow-up in: 6 months You will receive a reminder letter in the mail two months in advance. If you don't receive a letter, please call our office to schedule the follow-up appointment.  Your physician discussed the importance of regular exercise and recommended that you start or continue a regular exercise program for good health.   

## 2013-02-08 NOTE — Progress Notes (Signed)
Patient ID: Maurice Johnston, male   DOB: 1960-06-09, 52 y.o.   MRN: [id number]    [address] [place], [place]  [address] Phone: [phone] Fax:  [phone]  Date:  02/08/2013   ID:  Maurice Johnston, DOB 23-Dec-1960, MRN [id number]  PCP:  Emeterio Reeve, MD   ASSESSMENT:  1. Coronary atherosclerotic heart disease, without angina since drug-eluting stent implantation in circumflex, June 2014 2. Marked obesity without much improvement and in fact weight gain since coronary intervention 3. Diabetes mellitus, with poor control 4. Hypertension with adequate control  PLAN:  1. Aerobic exercise and caloric reduction or stressed to help with weight reduction. 2. Clinical followup in 6 months 3. Call if symptoms 4. Stressed consideration of lifestyle changes with relation to emotional and work related stress    SUBJECTIVE: Maurice Johnston is a 52 y.o. male was disappointed that he has been unable to gain weight. He is not able to get control of his lifestyle to allow dietary restrictions. He is gaining weight since the coronary intervention. His job is very stressful. He talks about different diets that he will want to pursue. I stressed that a modified calorie intake suitable for diabetes control is all that he needs to do. He is not exercising. He denies dyspnea. He has not used nitroglycerin.   Wt Readings from Last 3 Encounters:  02/08/13 268 lb (121.564 kg)  10/14/12 263 lb 7.2 oz (119.5 kg)  09/07/12 260 lb 12.9 oz (118.3 kg)     Past Medical History  Diagnosis Date  . Diabetes mellitus without complication   . Hypertension   . Hypercholesterolemia   . Hyperlipidemia LDL goal < 70   . Unstable angina pectoris     Onset 08/28/2012. Severe multivessel diabetic diffuse 3VCAD   . Morbid obesity   . Pleuritic chest pain     Left lower chest / RUQ pain worse with laying flat - ?Musculoskeletal versus pleurisy/pericarditis vs other   . Splenomegaly    Maybe associated with thrombocytopenia   . Thrombocytopenia     Current Outpatient Prescriptions  Medication Sig Dispense Refill  . aspirin EC 81 MG tablet Take 81 mg by mouth daily.      Marland Kitchen atorvastatin (LIPITOR) 40 MG tablet Take 40 mg by mouth daily.      Marland Kitchen esomeprazole (NEXIUM) 40 MG packet Take 40 mg by mouth at bedtime and may repeat dose one time if needed.      . insulin glargine (LANTUS) 100 UNIT/ML injection Inject 100 Units into the skin daily. Pt takes qAM      . insulin lispro (HUMALOG) 100 UNIT/ML injection Inject 40 Units into the skin 3 (three) times daily before meals.      . insulin lispro (HUMALOG) 100 UNIT/ML injection Inject 1-20 Units into the skin See admin instructions. Sliding scale insulin tid with meals. Takes one unit for every 10 above 100 cbg.      . metoprolol succinate (TOPROL-XL) 50 MG 24 hr tablet Take 1 tablet (50 mg total) by mouth daily. Take with or immediately following a meal.  30 tablet  11  . nitroGLYCERIN (NITROSTAT) 0.4 MG SL tablet Place 1 tablet (0.4 mg total) under the tongue every 5 (five) minutes x 3 doses as needed for chest pain.  25 tablet  12  . omega-3 acid ethyl esters (LOVAZA) 1 G capsule Take 4 g by mouth at bedtime and may repeat dose one time if needed.      Marland Kitchen  telmisartan (MICARDIS) 40 MG tablet Take 40 mg by mouth daily.      . Ticagrelor (BRILINTA) 90 MG TABS tablet Take 1 tablet (90 mg total) by mouth 2 (two) times daily.  60 tablet  11   No current facility-administered medications for this visit.    Allergies:   No Known Allergies  Social History:  The patient  reports that he has never smoked. He does not have any smokeless tobacco history on file. He reports that he drinks alcohol. He reports that he does not use illicit drugs.   ROS:  Please see the history of present illness.   Increasing obesity. No angina. Denies dyspnea.   All other systems reviewed and negative.   OBJECTIVE: VS:  BP 138/78  Pulse 70  Ht 5\' 10"  (1.778  m)  Wt 268 lb (121.564 kg)  BMI 38.45 kg/m2 Well nourished, well developed, in no acute distress, morbidly obese  HEENT: normal Neck: JVD absent. Carotid bruit absent  Cardiac:  normal S1, S2; RRR; no murmur Lungs:  clear to auscultation bilaterally, no wheezing, rhonchi or rales Abd: soft, nontender, no hepatomegaly Ext: Edema absent  Pulses 2+  Skin: warm and dry Neuro:  CNs 2-12 intact, no focal abnormalities noted  EKG:  Not repeated       Signed, Darci Needle [name], MD 02/08/2013 10:05 AM

## 2013-08-24 ENCOUNTER — Telehealth: Payer: Self-pay | Admitting: Interventional Cardiology

## 2013-08-24 NOTE — Telephone Encounter (Signed)
New Message:  Pt is asking if he needs to continue taking his Berlinta. Pt states he was told to take it for a year. Pt requests a call back from the nurse

## 2013-08-24 NOTE — Telephone Encounter (Signed)
returned pt call. lmtcb 

## 2013-08-26 NOTE — Telephone Encounter (Signed)
Forwarded to Fisher ScientificLisa Johnston, as patient was awaiting follow up response from a specific question.

## 2013-08-26 NOTE — Telephone Encounter (Signed)
Follow up     Returning a nurses call from several days ago.  Also, pt want to know if Dr Maurice Johnston thinks he can have gastric bypass surgery.

## 2013-08-29 MED ORDER — TICAGRELOR 90 MG PO TABS: 90.0000 mg | ORAL_TABLET | Freq: Two times a day (BID) | ORAL | Status: DC

## 2013-08-29 NOTE — Telephone Encounter (Signed)
Follow up     Pt is scheduled to see Dr Katrinka BlazingSmith end of July---he will run out of brilinta before the appt---should he refill the medication or can he stop taking it.  He has been taking it for a year.

## 2013-08-29 NOTE — Telephone Encounter (Signed)
pt adv to keep taking Brilinta.pt will keep upcoming appt with Dr.Smith for Aug 2015.pt will discuss possible gastric bypass surgery at that time.pt agreeablwe with plan and verbalized understanding.

## 2013-09-14 ENCOUNTER — Encounter: Payer: Self-pay | Admitting: *Deleted

## 2013-09-15 ENCOUNTER — Other Ambulatory Visit: Payer: Self-pay

## 2013-09-15 MED ORDER — METOPROLOL SUCCINATE ER 50 MG PO TB24: 50.0000 mg | ORAL_TABLET | Freq: Every day | ORAL | Status: DC

## 2013-10-10 ENCOUNTER — Ambulatory Visit (INDEPENDENT_AMBULATORY_CARE_PROVIDER_SITE_OTHER): Payer: BC Managed Care – PPO | Admitting: Interventional Cardiology

## 2013-10-10 ENCOUNTER — Encounter: Payer: Self-pay | Admitting: Interventional Cardiology

## 2013-10-10 VITALS — BP 142/73 | HR 70 | Ht 70.0 in | Wt 269.0 lb

## 2013-10-10 DIAGNOSIS — I2510 Atherosclerotic heart disease of native coronary artery without angina pectoris: Secondary | ICD-10-CM

## 2013-10-10 DIAGNOSIS — I10 Essential (primary) hypertension: Secondary | ICD-10-CM

## 2013-10-10 DIAGNOSIS — E785 Hyperlipidemia, unspecified: Secondary | ICD-10-CM

## 2013-10-10 MED ORDER — CLOPIDOGREL BISULFATE 75 MG PO TABS: 75.0000 mg | ORAL_TABLET | Freq: Every day | ORAL | Status: DC

## 2013-10-10 NOTE — Progress Notes (Signed)
Patient ID: Maurice Johnston, male   DOB: 07/17/1960, 53 y.o.   MRN: [id number]    [address] [place], [place]  [id number] Phone: [phone](336) [phone] Fax:  [phone]) [phone]  Date:  10/10/2013   ID:  Maurice Johnston, DOB 11/16/1960, MRN [id number]  PCP:  Emeterio ReeveWOLTERS,SHARON A, MD   ASSESSMENT:  1. Coronary atherosclerotic heart disease, status post PCI which rendered the patient asymptomatic. He is drug-eluting stents 2. Hypertension, controlled. 3. Obesity, a significant comorbidity that drives his diabetes and increases the risk of recurrent cardiovascular complications to 4. Diabetes mellitus, type II with complications 5. Hyperlipidemia  PLAN:  1. Maintain an active lifestyle 2. Discontinue Brilinta and start clopidogrel 75 mg per day 3. Weight loss 4. I will CLEAR the patient for bariatric surgery when he is ready and has completed the lengthy process involved to   SUBJECTIVE: Maurice Johnston is a 53 y.o. male who is asymptomatic with reference to angina. He walks up to 5 times per week for an hour. No angina or change in tolerance is been noted in the last several months. He denies edema. He looks forward to having bariatric surgery to help better control his diabetes and blood pressure .   Wt Readings from Last 3 Encounters:  10/10/13 269 lb (122.018 kg)  02/08/13 268 lb (121.564 kg)  10/14/12 263 lb 7.2 oz (119.5 kg)     Past Medical History  Diagnosis Date  . Diabetes mellitus without complication   . Hypertension   . Hypercholesterolemia   . Hyperlipidemia LDL goal < 70   . Unstable angina pectoris     Onset 08/28/2012. Severe multivessel diabetic diffuse 3VCAD   . Morbid obesity   . Pleuritic chest pain     Left lower chest / RUQ pain worse with laying flat - ?Musculoskeletal versus pleurisy/pericarditis vs other   . Splenomegaly     Maybe associated with thrombocytopenia   . Thrombocytopenia     Current Outpatient Prescriptions  Medication Sig Dispense  Refill  . albuterol (PROVENTIL HFA;VENTOLIN HFA) 108 (90 BASE) MCG/ACT inhaler Inhale 2 puffs into the lungs every 4 (four) hours as needed for wheezing or shortness of breath.      Marland Kitchen. aspirin EC 81 MG tablet Take 81 mg by mouth daily.      Marland Kitchen. atorvastatin (LIPITOR) 40 MG tablet Take 40 mg by mouth daily.      Marland Kitchen. esomeprazole (NEXIUM) 40 MG packet Take 40 mg by mouth at bedtime and may repeat dose one time if needed.      . fluticasone (FLONASE) 50 MCG/ACT nasal spray Place 2 sprays into both nostrils as needed.       . insulin aspart (NOVOLOG) 100 UNIT/ML FlexPen Inject 100 Units into the skin 3 (three) times daily with meals.      . insulin glargine (LANTUS) 100 UNIT/ML injection Inject 100 Units into the skin daily. Pt takes qAM      . metFORMIN (GLUCOPHAGE) 500 MG tablet Take 1 tablet by mouth 2 (two) times daily.      . metoprolol succinate (TOPROL-XL) 50 MG 24 hr tablet Take 1 tablet (50 mg total) by mouth daily. Take with or immediately following a meal.  30 tablet  3  . nitroGLYCERIN (NITROSTAT) 0.4 MG SL tablet Place 1 tablet (0.4 mg total) under the tongue every 5 (five) minutes x 3 doses as needed for chest pain.  25 tablet  12  . omega-3 acid ethyl esters (LOVAZA) 1  G capsule Take 4 g by mouth at bedtime and may repeat dose one time if needed.      Marland Kitchen telmisartan (MICARDIS) 40 MG tablet Take 40 mg by mouth daily.      . temazepam (RESTORIL) 30 MG capsule Take 1 mg by mouth daily.      . ticagrelor (BRILINTA) 90 MG TABS tablet Take 1 tablet (90 mg total) by mouth 2 (two) times daily.  60 tablet  3   No current facility-administered medications for this visit.    Allergies:   No Known Allergies  Social History:  The patient  reports that he has never smoked. He does not have any smokeless tobacco history on file. He reports that he drinks alcohol. He reports that he does not use illicit drugs.   ROS:  Please see the history of present illness.   No orthopnea or PND   All other systems  reviewed and negative.   OBJECTIVE: VS:  BP 142/73  Pulse 70  Ht 5\' 10"  (1.778 m)  Wt 269 lb (122.018 kg)  BMI 38.60 kg/m2 Well nourished, well developed, in no acute distress, obese HEENT: normal Neck: JVD flat. Carotid bruit absent  Cardiac:  normal S1, S2; RRR; no murmur Lungs:  clear to auscultation bilaterally, no wheezing, rhonchi or rales Abd: soft, nontender, no hepatomegaly Ext: Edema absent. Pulses 2+ and symmetric none performed Skin: warm and dry Neuro:  CNs 2-12 intact, no focal abnormalities noted  EKG:  Not performed       Signed, Darci Needle [name], MD 10/10/2013 10:14 AM

## 2013-10-10 NOTE — Patient Instructions (Addendum)
Your physician has recommended you make the following change in your medication:  1) Complete your supply of Brilinta at home 2) Then start Clopidogrel (Plavix) 75 mg daily an Rx has been sent to your pharmacy.  Take all other medications as prescribed  Your physician discussed the importance of regular exercise and recommended that you start or continue a regular exercise program for good health.  Your physician wants you to follow-up in: 1 year with Dr.Smith You will receive a reminder letter in the mail two months in advance. If you don't receive a letter, please call our office to schedule the follow-up appointment.

## 2013-10-20 ENCOUNTER — Encounter (INDEPENDENT_AMBULATORY_CARE_PROVIDER_SITE_OTHER): Payer: Self-pay

## 2013-10-20 ENCOUNTER — Ambulatory Visit (INDEPENDENT_AMBULATORY_CARE_PROVIDER_SITE_OTHER): Payer: BC Managed Care – PPO | Admitting: General Surgery

## 2013-10-20 ENCOUNTER — Encounter (INDEPENDENT_AMBULATORY_CARE_PROVIDER_SITE_OTHER): Payer: Self-pay | Admitting: General Surgery

## 2013-10-20 DIAGNOSIS — N189 Chronic kidney disease, unspecified: Secondary | ICD-10-CM

## 2013-10-20 DIAGNOSIS — E1329 Other specified diabetes mellitus with other diabetic kidney complication: Secondary | ICD-10-CM

## 2013-10-20 DIAGNOSIS — I10 Essential (primary) hypertension: Secondary | ICD-10-CM

## 2013-10-20 DIAGNOSIS — E785 Hyperlipidemia, unspecified: Secondary | ICD-10-CM

## 2013-10-20 DIAGNOSIS — E0822 Diabetes mellitus due to underlying condition with diabetic chronic kidney disease: Secondary | ICD-10-CM

## 2013-10-20 NOTE — Progress Notes (Signed)
Subjective:   Morbid obesity, diabetes  Patient ID: Maurice Johnston, male   DOB: 06/10/1960, 53 y.o.   MRN: [id number]  HPI Maurice MaduroRobert [id number] y.o.male referred by Maurice Mila PalmerSharon Johnston for consideration for surgical treatment for morbid obesity.  He  gives a history of progressive obesity since early adulthood despite multiple attempts at medical management. He has been Able to lose about 15 pounds the time when effort such as Weight Watchers but Then experiences progressive weight regain. his weight has been affecting him in a number of ways including The onset of insulin-dependent diabetes mellitus about 9 years ago and now requiring high doses of insulin.  He also has developed coronary artery disease having had a stent placed one year ago. He has hyperlipidemia and hypertension and is very concerned about his long-term health. His wife has undergone successful gastric bypass surgery with marked benefits..   He has been to our initial information seminar, researched surgical options thoroughly and is interested in Gastric bypass.  Past Medical History  Diagnosis Date  . Diabetes mellitus without complication   . Hypertension   . Hypercholesterolemia   . Hyperlipidemia LDL goal < 70   . Unstable angina pectoris     Onset 08/28/2012. Severe multivessel diabetic diffuse 3VCAD   . Morbid obesity   . Pleuritic chest pain     Left lower chest / RUQ pain worse with laying flat - ?Musculoskeletal versus pleurisy/pericarditis vs other   . Splenomegaly     Maybe associated with thrombocytopenia   . Thrombocytopenia    Past Surgical History  Procedure Laterality Date  . Vasectomy    . Carotid stent     Current Outpatient Prescriptions  Medication Sig Dispense Refill  . albuterol (PROVENTIL HFA;VENTOLIN HFA) 108 (90 BASE) MCG/ACT inhaler Inhale 2 puffs into the lungs every 4 (four) hours as needed for wheezing or shortness of breath.      Marland Kitchen. aspirin EC 81 MG tablet Take 81 mg by mouth daily.      Marland Kitchen.  atorvastatin (LIPITOR) 40 MG tablet Take 40 mg by mouth daily.      . clopidogrel (PLAVIX) 75 MG tablet Take 1 tablet (75 mg total) by mouth daily.  30 tablet  11  . esomeprazole (NEXIUM) 40 MG packet Take 40 mg by mouth at bedtime and may repeat dose one time if needed.      . fluticasone (FLONASE) 50 MCG/ACT nasal spray Place 2 sprays into both nostrils as needed.       . insulin aspart (NOVOLOG) 100 UNIT/ML FlexPen Inject 100 Units into the skin 3 (three) times daily with meals.      . insulin glargine (LANTUS) 100 UNIT/ML injection Inject 100 Units into the skin daily. Pt takes qAM      . metFORMIN (GLUCOPHAGE) 500 MG tablet Take 1 tablet by mouth 2 (two) times daily.      . metoprolol succinate (TOPROL-XL) 50 MG 24 hr tablet Take 1 tablet (50 mg total) by mouth daily. Take with or immediately following a meal.  30 tablet  3  . nitroGLYCERIN (NITROSTAT) 0.4 MG SL tablet Place 1 tablet (0.4 mg total) under the tongue every 5 (five) minutes x 3 doses as needed for chest pain.  25 tablet  12  . omega-3 acid ethyl esters (LOVAZA) 1 G capsule Take 4 g by mouth at bedtime and may repeat dose one time if needed.      Marland Kitchen. telmisartan (MICARDIS) 40 MG tablet Take 40 mg  by mouth daily.      . temazepam (RESTORIL) 30 MG capsule Take 1 mg by mouth daily.       No current facility-administered medications for this visit.   No Known Allergies History  Substance Use Topics  . Smoking status: Never Smoker   . Smokeless tobacco: Not on file  . Alcohol Use: Yes     Comment: occasional       Review of Systems  HENT: Negative.   Respiratory: Positive for shortness of breath (Mild, exertional). Negative for cough and choking.   Cardiovascular: Negative for chest pain, palpitations and leg swelling.  Gastrointestinal: Negative.        Mild reflux controlled with medications  Genitourinary: Negative.   Musculoskeletal: Negative.        Objective:   Physical Exam BP 126/76  Pulse 56  Temp(Src) 97  F (36.1 C)  Resp 16  Ht 5\' 10"  (1.778 m)  Wt 268 lb (121.564 kg)  BMI 38.45 kg/m2 General: Alert, Obese Caucasian male, in no distress Skin: Warm and dry without rash or infection. HEENT: No palpable masses or thyromegaly. Sclera nonicteric. Pupils equal round and reactive. Oropharynx clear. Lymph nodes: No cervical, supraclavicular, or inguinal nodes palpable. Lungs: Breath sounds clear and equal without increased work of breathing Cardiovascular: Regular rate and rhythm without murmur. No JVD or edema. Peripheral pulses intact. Abdomen: Nondistended. Soft and nontender. No masses palpable. No organomegaly. No palpable hernias. Extremities: No edema or joint swelling or deformity. No chronic venous stasis changes. Neurologic: Alert and fully oriented. Gait normal.    Assessment:     Patient with progressive morbid obesity unresponsive to multiple efforts at medical management who presents with a BMI of 38.5 and comorbidities of Insulin-dependent diabetes mellitus, coronary artery disease, hyperlipidemia, hypertension and GERD.. I believe there would be very significant medical benefit from surgical weight loss, Particularly in light of his diabetes.. After our discussion of surgical options currently available the patient has decided to proceed with laparoscopic Roux-en-Y gastric bypass due to the reasons above. We have discussed the nature of the problem and the risks of remaining morbidly obese. We discussed laparoscopic Roux-en-Y gastric bypass in detail including the nature of the procedure, expected hospitalization and recovery, and major risks of anesthetic complications, bleeding, blood clots and pulmonary emboli, leakage and infection and long-term risks of stricture, ulceration, bowel obstruction, nutritional deficiencies, and failure to lose weight or weight regain. We discussed these problems could lead to death. The patient was given a complete consent form to review and all questions  were answered. We will go ahead with preoperative including psychological and nutrition evaluations, H. pylori testing, ultrasound, bone density, and routine lab and x-rays. I will see the patient back following these studies.    Plan:     . Preoperative workup for planned laparoscopic Roux-en-Y gastric bypass as above

## 2013-10-29 LAB — CBC WITH DIFFERENTIAL/PLATELET
BASOS ABS: 0 10*3/uL (ref 0.0–0.1)
Basophils Relative: 0 % (ref 0–1)
EOS ABS: 0.2 10*3/uL (ref 0.0–0.7)
Eosinophils Relative: 2 % (ref 0–5)
HEMATOCRIT: 43.8 % (ref 39.0–52.0)
Hemoglobin: 15 g/dL (ref 13.0–17.0)
LYMPHS PCT: 18 % (ref 12–46)
Lymphs Abs: 1.5 10*3/uL (ref 0.7–4.0)
MCH: 28.7 pg (ref 26.0–34.0)
MCHC: 34.2 g/dL (ref 30.0–36.0)
MCV: 83.9 fL (ref 78.0–100.0)
MONO ABS: 0.4 10*3/uL (ref 0.1–1.0)
Monocytes Relative: 5 % (ref 3–12)
Neutro Abs: 6.1 10*3/uL (ref 1.7–7.7)
Neutrophils Relative %: 75 % (ref 43–77)
Platelets: 172 10*3/uL (ref 150–400)
RBC: 5.22 MIL/uL (ref 4.22–5.81)
RDW: 13.9 % (ref 11.5–15.5)
WBC: 8.1 10*3/uL (ref 4.0–10.5)

## 2013-10-29 LAB — COMPREHENSIVE METABOLIC PANEL
ALBUMIN: 4.2 g/dL (ref 3.5–5.2)
ALT: 30 U/L (ref 0–53)
AST: 20 U/L (ref 0–37)
Alkaline Phosphatase: 81 U/L (ref 39–117)
BUN: 20 mg/dL (ref 6–23)
CO2: 23 meq/L (ref 19–32)
Calcium: 9 mg/dL (ref 8.4–10.5)
Chloride: 101 mEq/L (ref 96–112)
Creat: 0.85 mg/dL (ref 0.50–1.35)
Glucose, Bld: 193 mg/dL — ABNORMAL HIGH (ref 70–99)
Potassium: 4.6 mEq/L (ref 3.5–5.3)
SODIUM: 132 meq/L — AB (ref 135–145)
TOTAL PROTEIN: 6.8 g/dL (ref 6.0–8.3)
Total Bilirubin: 0.6 mg/dL (ref 0.2–1.2)

## 2013-10-29 LAB — T4: T4, Total: 9.3 ug/dL (ref 5.0–12.5)

## 2013-10-29 LAB — LIPID PANEL
Cholesterol: 115 mg/dL (ref 0–200)
HDL: 34 mg/dL — AB (ref >39)
LDL Cholesterol: 61 mg/dL (ref 0–99)
TRIGLYCERIDES: 100 mg/dL (ref <150)
Total CHOL/HDL Ratio: 3.4 Ratio
VLDL: 20 mg/dL (ref 0–40)

## 2013-10-29 LAB — IRON AND TIBC
%SAT: 18 % — AB (ref 20–55)
Iron: 70 ug/dL (ref 42–165)
TIBC: 390 ug/dL (ref 215–435)
UIBC: 320 ug/dL (ref 125–400)

## 2013-10-29 LAB — TSH: TSH: 0.795 u[IU]/mL (ref 0.350–4.500)

## 2013-10-29 LAB — HEMOGLOBIN A1C
HEMOGLOBIN A1C: 7.4 % — AB (ref <5.7)
MEAN PLASMA GLUCOSE: 166 mg/dL — AB (ref <117)

## 2013-10-29 LAB — VITAMIN B12: Vitamin B-12: 408 pg/mL (ref 211–911)

## 2013-10-29 LAB — FOLATE: Folate: 6.8 ng/mL

## 2013-11-02 LAB — VITAMIN D 1,25 DIHYDROXY
VITAMIN D 1, 25 (OH) TOTAL: 50 pg/mL (ref 18–72)
VITAMIN D3 1, 25 (OH): 50 pg/mL
Vitamin D2 1, 25 (OH)2: <8 pg/mL

## 2013-11-16 ENCOUNTER — Encounter: Payer: Self-pay | Admitting: Dietician

## 2013-11-16 ENCOUNTER — Encounter: Payer: BC Managed Care – PPO | Attending: General Surgery | Admitting: Dietician

## 2013-11-16 ENCOUNTER — Ambulatory Visit: Payer: BC Managed Care – PPO | Admitting: Dietician

## 2013-11-16 DIAGNOSIS — Z713 Dietary counseling and surveillance: Secondary | ICD-10-CM | POA: Diagnosis not present

## 2013-11-16 DIAGNOSIS — Z01818 Encounter for other preprocedural examination: Secondary | ICD-10-CM | POA: Diagnosis not present

## 2013-11-16 DIAGNOSIS — Z6838 Body mass index (BMI) 38.0-38.9, adult: Secondary | ICD-10-CM | POA: Diagnosis not present

## 2013-11-16 NOTE — Progress Notes (Signed)
  Pre-Op Assessment Visit:  Pre-Operative RYGB Surgery  Medical Nutrition Therapy:  Appt start time: 1430   End time:  1515.  Patient was seen on 11/16/2013 for Pre-Operative RYGB Nutrition Assessment. Assessment and letter of approval faxed to [facility] Surgery Bariatric Surgery Program coordinator on 11/16/2013.   Preferred Learning Style:   No preference indicated   Learning Readiness:   Ready  Handouts given during visit include:  Pre-Op Goals Bariatric Surgery Protein Shakes  Teaching Method Utilized:  Visual Auditory Hands on  Barriers to learning/adherence to lifestyle change: none  Demonstrated degree of understanding via:  Teach Back   Patient to call the [facility] to enroll in Pre-Op and Post-Op Nutrition Education when surgery date is scheduled.

## 2013-11-16 NOTE — Patient Instructions (Signed)
Follow Pre-Op Goals Try Protein Shakes Call [facility] at [phone] when surgery is scheduled to enroll in Pre-Op Class 

## 2013-11-28 ENCOUNTER — Ambulatory Visit (HOSPITAL_COMMUNITY)
Admission: RE | Admit: 2013-11-28 | Discharge: 2013-11-28 | Disposition: A | Discharge status: Home / Self Care | Payer: BC Managed Care – PPO | Source: Ambulatory Visit | Attending: General Surgery | Admitting: General Surgery

## 2013-11-28 ENCOUNTER — Ambulatory Visit (HOSPITAL_COMMUNITY): Admission: RE | Admit: 2013-11-28 | Payer: BC Managed Care – PPO | Source: Ambulatory Visit | Admitting: General Surgery

## 2013-11-28 ENCOUNTER — Encounter (INDEPENDENT_AMBULATORY_CARE_PROVIDER_SITE_OTHER): Payer: Self-pay

## 2013-11-28 ENCOUNTER — Encounter (HOSPITAL_COMMUNITY): Admission: RE | Payer: Self-pay | Source: Ambulatory Visit

## 2013-11-28 DIAGNOSIS — E0822 Diabetes mellitus due to underlying condition with diabetic chronic kidney disease: Secondary | ICD-10-CM

## 2013-11-28 DIAGNOSIS — I10 Essential (primary) hypertension: Secondary | ICD-10-CM

## 2013-11-28 DIAGNOSIS — I129 Hypertensive chronic kidney disease with stage 1 through stage 4 chronic kidney disease, or unspecified chronic kidney disease: Secondary | ICD-10-CM | POA: Diagnosis not present

## 2013-11-28 DIAGNOSIS — K7689 Other specified diseases of liver: Secondary | ICD-10-CM | POA: Diagnosis not present

## 2013-11-28 DIAGNOSIS — E785 Hyperlipidemia, unspecified: Secondary | ICD-10-CM

## 2013-11-28 DIAGNOSIS — E1329 Other specified diabetes mellitus with other diabetic kidney complication: Secondary | ICD-10-CM | POA: Insufficient documentation

## 2013-11-28 DIAGNOSIS — Z6838 Body mass index (BMI) 38.0-38.9, adult: Secondary | ICD-10-CM | POA: Diagnosis not present

## 2013-11-28 DIAGNOSIS — Z13820 Encounter for screening for osteoporosis: Secondary | ICD-10-CM | POA: Diagnosis not present

## 2013-11-28 DIAGNOSIS — K8020 Calculus of gallbladder without cholecystitis without obstruction: Secondary | ICD-10-CM | POA: Insufficient documentation

## 2013-11-28 DIAGNOSIS — N189 Chronic kidney disease, unspecified: Secondary | ICD-10-CM | POA: Diagnosis not present

## 2013-11-28 SURGERY — BREATH TEST, FOR HELICOBACTER PYLORI

## 2013-12-12 ENCOUNTER — Encounter (HOSPITAL_COMMUNITY)
Admission: RE | Disposition: A | Discharge status: Home / Self Care | Payer: Self-pay | Source: Ambulatory Visit | Attending: General Surgery

## 2013-12-12 ENCOUNTER — Ambulatory Visit (HOSPITAL_COMMUNITY)
Admission: RE | Admit: 2013-12-12 | Discharge: 2013-12-12 | Disposition: A | Discharge status: Home / Self Care | Payer: BC Managed Care – PPO | Source: Ambulatory Visit | Attending: General Surgery | Admitting: General Surgery

## 2013-12-12 DIAGNOSIS — E669 Obesity, unspecified: Secondary | ICD-10-CM | POA: Insufficient documentation

## 2013-12-12 HISTORY — PX: BREATH TEK H PYLORI: SHX5422

## 2013-12-12 SURGERY — BREATH TEST, FOR HELICOBACTER PYLORI

## 2013-12-12 NOTE — Progress Notes (Signed)
12/12/13 1610  BREATH TEK ASSESSMENT  Referring MD Glenna Fellows  Time of Last PO Intake 0545 (sip water 12/12/2013)  Baseline Breath At: 0750  Pranactin Given At: 0750  Post-Dose Breath At: 0805  Sample 1 1.7  Sample 2 2.2  Test Negative

## 2013-12-13 ENCOUNTER — Encounter (HOSPITAL_COMMUNITY): Payer: Self-pay | Admitting: General Surgery

## 2013-12-21 ENCOUNTER — Other Ambulatory Visit (INDEPENDENT_AMBULATORY_CARE_PROVIDER_SITE_OTHER): Payer: Self-pay | Admitting: General Surgery

## 2013-12-24 ENCOUNTER — Ambulatory Visit: Payer: BC Managed Care – PPO | Admitting: Dietician

## 2014-01-06 ENCOUNTER — Other Ambulatory Visit: Payer: Self-pay | Admitting: Interventional Cardiology

## 2014-01-08 ENCOUNTER — Other Ambulatory Visit: Payer: Self-pay | Admitting: Interventional Cardiology

## 2014-01-09 ENCOUNTER — Encounter: Payer: BC Managed Care – PPO | Attending: General Surgery

## 2014-01-09 DIAGNOSIS — Z6833 Body mass index (BMI) 33.0-33.9, adult: Secondary | ICD-10-CM | POA: Diagnosis not present

## 2014-01-09 DIAGNOSIS — Z713 Dietary counseling and surveillance: Secondary | ICD-10-CM | POA: Diagnosis not present

## 2014-01-10 NOTE — Progress Notes (Signed)
  Pre-Operative Nutrition Class:  Appt start time: 3335   End time:  1830.  Patient was seen on 01/09/14 for Pre-Operative Bariatric Surgery Education at the [facility].   Surgery date: 01/30/2014 Surgery type: RYGB Start weight at [facility]: 266.5 lbs on 11/16/2013 Weight today: 263 lbs  TANITA  BODY COMP RESULTS  01/09/14   BMI (kg/m^2) 37.7   Fat Mass (lbs) 92.5   Fat Free Mass (lbs) 170.5   Total Body Water (lbs) 125   Samples given per MNT protocol. Patient educated on appropriate usage: Unjury protein powder (chocolate) - qty 1 Lot #: [id number] Exp: 12/2014  Premier protein shake (strawberry) - qty 1 Lot #: [id number] Exp: 07/2014  Celebrate Vitamins Multivitamin (grape) - qty 1 Lot #: [id number] Exp: 07/2014  PB2 - qty 1 Lot #: [id number]  Exp: 10/2014  The following the learning objectives were met by the patient during this course:  Identify Pre-Op Dietary Goals and will begin 2 weeks pre-operatively  Identify appropriate sources of fluids and proteins   State protein recommendations and appropriate sources pre and post-operatively  Identify Post-Operative Dietary Goals and will follow for 2 weeks post-operatively  Identify appropriate multivitamin and calcium sources  Describe the need for physical activity post-operatively and will follow MD recommendations  State when to call healthcare provider regarding medication questions or post-operative complications  Handouts given during class include:  Pre-Op Bariatric Surgery Diet Handout  Protein Shake Handout  Post-Op Bariatric Surgery Nutrition Handout  [facility] Program Information Flyer  Support Group Information Flyer  [facility] Bariatric Supplements Price List  Follow-Up Plan: Patient will follow-up at [facility] 2 weeks post operatively for diet advancement per MD.

## 2014-01-17 ENCOUNTER — Telehealth: Payer: Self-pay | Admitting: Interventional Cardiology

## 2014-01-17 NOTE — Telephone Encounter (Signed)
Returned pt cal pt sts that his Gastric bypass sx is scheduled on 11/16.he will need to hold his Plavix starting 11/9. Pt also needs to be switched from Metoprolol Succinate to Metoprolol Tartrate. He was adv that after sx his body will not be able  to absorb extended release medications. Pt adv that at his last o/v with Dr.Smith on 7/27. Dr.Smith did anticipate clearing him for sx.adv him that Dr.Smit is out of the office today.I will fwd him a message and call back with his response. Pt agreeable and verbalized understanding.

## 2014-01-17 NOTE — Telephone Encounter (Signed)
New message     Having gastric bypass on 11-16.  Need clearance to be off plavix prior to surgery----stop 11-9.  Also talk to the nurse about stopping metoprolol extended release tablet

## 2014-01-19 MED ORDER — METOPROLOL TARTRATE 25 MG PO TABS: 25.0000 mg | ORAL_TABLET | Freq: Two times a day (BID) | ORAL | Status: DC

## 2014-01-19 NOTE — Telephone Encounter (Signed)
Okay to hold Plavix. Cleared for surgery. Okay to change metoprolol to tartrate 25 mg BID

## 2014-01-19 NOTE — Telephone Encounter (Signed)
Pt aware of Dr.Smith response with verbal understanding.     Okay to hold Plavix. Cleared for surgery. Okay to change metoprolol to tartrate 25 mg BID

## 2014-01-24 NOTE — Patient Instructions (Addendum)
Maurice CapriceRobert Johnston  01/24/2014   Your procedure is scheduled on:  01/30/2014    Come thru the Emergency Room Entrance.  .  Follow the Signs to Short Stay Center at   0515     am  Call this number if you have problems the morning of surgery: [phone]   Remember:   Do not eat food or drink liquids after midnight.   Take these medicines the morning of surgery with A SIP OF WATER: Albuterol inhaler if needed, Bring inhaler with you, Flonase nasal spray if needed, Toprol,              Take 1/2 of evening dose of Insulin nite before surgery.     Do not wear jewelry,   Do not wear lotions, powders, or perfumes.  deodorant.  . Men may shave face and neck.  Do not bring valuables to the hospital.  Contacts, dentures or bridgework may not be worn into surgery.  Leave suitcase in the car. After surgery it may be brought to your room.  For patients admitted to the hospital, checkout time is 11:00 AM the day of  discharge.          Please read over the following fact sheets that you were given: MRSA Information, coughing and deep breathing exercises, leg exercises            [facility] - Preparing for Surgery Before surgery, you can play an important role.  Because skin is not sterile, your skin needs to be as free of germs as possible.  You can reduce the number of germs on your skin by washing with CHG (chlorahexidine gluconate) soap before surgery.  CHG is an antiseptic cleaner which kills germs and bonds with the skin to continue killing germs even after washing. Please DO NOT use if you have an allergy to CHG or antibacterial soaps.  If your skin becomes reddened/irritated stop using the CHG and inform your nurse when you arrive at Short Stay. Do not shave (including legs and underarms) for at least 48 hours prior to the first CHG shower.  You may shave your face/neck. Please follow these instructions carefully:  1.  Shower with CHG Soap the night before surgery and the  morning of  Surgery.  2.  If you choose to wash your hair, wash your hair first as usual with your  normal  shampoo.  3.  After you shampoo, rinse your hair and body thoroughly to remove the  shampoo.                           4.  Use CHG as you would any other liquid soap.  You can apply chg directly  to the skin and wash                       Gently with a scrungie or clean washcloth.  5.  Apply the CHG Soap to your body ONLY FROM THE NECK DOWN.   Do not use on face/ open                           Wound or open sores. Avoid contact with eyes, ears mouth and genitals (private parts).                       Wash face,  Genitals (private parts) with your  normal soap.             6.  Wash thoroughly, paying special attention to the area where your surgery  will be performed.  7.  Thoroughly rinse your body with warm water from the neck down.  8.  DO NOT shower/wash with your normal soap after using and rinsing off  the CHG Soap.                9.  Pat yourself dry with a clean towel.            10.  Wear clean pajamas.            11.  Place clean sheets on your bed the night of your first shower and do not  sleep with pets. Day of Surgery : Do not apply any lotions/deodorants the morning of surgery.  Please wear clean clothes to the hospital/surgery center.  FAILURE TO FOLLOW THESE INSTRUCTIONS MAY RESULT IN THE CANCELLATION OF YOUR SURGERY PATIENT SIGNATURE_________________________________  NURSE SIGNATURE__________________________________  ________________________________________________________________________

## 2014-01-25 ENCOUNTER — Encounter (HOSPITAL_COMMUNITY): Payer: Self-pay

## 2014-01-25 ENCOUNTER — Encounter (HOSPITAL_COMMUNITY)
Admission: RE | Admit: 2014-01-25 | Discharge: 2014-01-25 | Disposition: A | Discharge status: Home / Self Care | Payer: BC Managed Care – PPO | Source: Ambulatory Visit | Attending: General Surgery | Admitting: General Surgery

## 2014-01-25 DIAGNOSIS — Z01812 Encounter for preprocedural laboratory examination: Secondary | ICD-10-CM | POA: Insufficient documentation

## 2014-01-25 HISTORY — DX: Family history of other specified conditions: Z84.89

## 2014-01-25 HISTORY — DX: Gastro-esophageal reflux disease without esophagitis: K21.9

## 2014-01-25 HISTORY — DX: Presence of coronary angioplasty implant and graft: Z95.5

## 2014-01-25 LAB — COMPREHENSIVE METABOLIC PANEL
ALT: 46 U/L (ref 0–53)
ANION GAP: 15 (ref 5–15)
AST: 35 U/L (ref 0–37)
Albumin: 4.2 g/dL (ref 3.5–5.2)
Alkaline Phosphatase: 78 U/L (ref 39–117)
BILIRUBIN TOTAL: 0.7 mg/dL (ref 0.3–1.2)
BUN: 21 mg/dL (ref 6–23)
CO2: 23 meq/L (ref 19–32)
Calcium: 10 mg/dL (ref 8.4–10.5)
Chloride: 99 mEq/L (ref 96–112)
Creatinine, Ser: 0.72 mg/dL (ref 0.50–1.35)
GFR calc Af Amer: >90 mL/min (ref >90)
GLUCOSE: 115 mg/dL — AB (ref 70–99)
Potassium: 4.6 mEq/L (ref 3.7–5.3)
Sodium: 137 mEq/L (ref 137–147)
Total Protein: 7.6 g/dL (ref 6.0–8.3)

## 2014-01-25 LAB — CBC WITH DIFFERENTIAL/PLATELET
Basophils Absolute: 0 10*3/uL (ref 0.0–0.1)
Basophils Relative: 0 % (ref 0–1)
Eosinophils Absolute: 0.1 10*3/uL (ref 0.0–0.7)
Eosinophils Relative: 1 % (ref 0–5)
HEMATOCRIT: 44.9 % (ref 39.0–52.0)
HEMOGLOBIN: 15.3 g/dL (ref 13.0–17.0)
LYMPHS PCT: 20 % (ref 12–46)
Lymphs Abs: 1.5 10*3/uL (ref 0.7–4.0)
MCH: 28.4 pg (ref 26.0–34.0)
MCHC: 34.1 g/dL (ref 30.0–36.0)
MCV: 83.3 fL (ref 78.0–100.0)
MONO ABS: 0.4 10*3/uL (ref 0.1–1.0)
MONOS PCT: 6 % (ref 3–12)
NEUTROS ABS: 5.3 10*3/uL (ref 1.7–7.7)
Neutrophils Relative %: 73 % (ref 43–77)
Platelets: 175 10*3/uL (ref 150–400)
RBC: 5.39 MIL/uL (ref 4.22–5.81)
RDW: 13.4 % (ref 11.5–15.5)
WBC: 7.3 10*3/uL (ref 4.0–10.5)

## 2014-01-25 NOTE — Progress Notes (Addendum)
EKG- 11/28/13 EPIC  CXR- 11/28/13 EPIC  LOV with Dr Verdis PrimeHenry Johnston- 10/10/2013  EPIC  ECHO- 08/2012 EPIC  Cath 08/2012 EPIC

## 2014-01-26 NOTE — Anesthesia Preprocedure Evaluation (Addendum)
Anesthesia Evaluation  Patient identified by MRN, date of birth, ID band Patient awake    Reviewed: Allergy & Precautions, H&P , NPO status , Patient's Chart, lab work & pertinent test results  Airway Mallampati: [name]  TM Distance: <3 FB Neck ROM: Full    Dental no notable dental hx.    Pulmonary neg pulmonary ROS,  breath sounds clear to auscultation  + decreased breath sounds      Cardiovascular hypertension, Pt. on medications and Pt. on home beta blockers + angina + CAD and + Cardiac Stents Rhythm:Regular Rate:Normal     Neuro/Psych negative neurological ROS  negative psych ROS   GI/Hepatic negative GI ROS, Neg liver ROS,   Endo/Other  diabetes  Renal/GU negative Renal ROS  negative genitourinary   Musculoskeletal negative musculoskeletal ROS (+)   Abdominal   Peds negative pediatric ROS (+)  Hematology negative hematology ROS (+)   Anesthesia Other Findings   Reproductive/Obstetrics negative OB ROS                            Anesthesia Physical Anesthesia Plan  ASA: [name]  Anesthesia Plan: General   Post-op Pain Management:    Induction: Intravenous  Airway Management Planned: Oral ETT  Additional Equipment:   Intra-op Plan:   Post-operative Plan: Extubation in OR  Informed Consent: I have reviewed the patients History and Physical, chart, labs and discussed the procedure including the risks, benefits and alternatives for the proposed anesthesia with the patient or authorized representative who has indicated his/her understanding and acceptance.   Dental advisory given  Plan Discussed with: CRNA  Anesthesia Plan Comments:         Anesthesia Quick Evaluation

## 2014-01-27 NOTE — H&P (Signed)
History of Present Illness Maurice Johnston(Maurice Marcucci T. [name] MD; 01/27/2014 9:31 AM) Patient words: pre op Gasstric By-pass.  The patient is a 53 year old male who presents with obesity. Maurice Maduroobert [id number] y.o.male referred by Dr Mila PalmerSharon Wolters for consideration for surgical treatment for morbid obesity. He gives a history of progressive obesity since early adulthood despite multiple attempts at medical management. He has been Able to lose about 15 pounds the time when effort such as Weight Watchers but Then experiences progressive weight regain. his weight has been affecting him in a number of ways including The onset of insulin-dependent diabetes mellitus about 9 years ago and now requiring high doses of insulin. He also has developed coronary artery disease having had a stent placed one year ago. He has hyperlipidemia and hypertension and is very concerned about his long-term health. His wife has undergone successful gastric bypass surgery with marked benefits.. He returns today for his preoperative visit. He has successfully completed cycle nutrition evaluations. He has obtained cardiac clearance from Dr. Katrinka BlazingSmith. He generally has been feeling pretty well with no intercurrent illness. He is on his preoperative diet and already feeling better from the 10-15 pounds he has lost on this. Gallbladder ultrasound did reveal gallstones and we discussed proceeding with cholecystectomy at the time of his gastric bypass. We discussed this procedure and its specific risks. Other Problems Maurice Johnston(Maurice Johnston, [name]; 01/27/2014 9:21 AM) Cholelithiasis Diabetes Mellitus Gastroesophageal Reflux Disease High blood pressure Hypercholesterolemia Other disease, cancer, significant illness  Past Surgical History Maurice Johnston(Maurice Johnston, [name]; 01/27/2014 9:21 AM) Vasectomy  Diagnostic Studies History Maurice Johnston(Maurice Johnston, [name]; 01/27/2014 9:21 AM) Colonoscopy 1-5 years ago  Allergies Maurice Johnston(Maurice Johnston, [name]; 01/27/2014 9:22 AM) No Known  Drug Allergies 01/27/2014  Medication History Maurice Johnston(Maurice Johnston, [name]; 01/27/2014 9:25 AM) Atorvastatin Calcium (40MG  Tablet, Oral) Active. BD Insulin Syr Ultrafine II (31G X 5/16"1 ML Misc,) Active. Metoprolol Succinate ER (50MG  Tablet ER 24HR, Oral) Active. Metoprolol Tartrate (25MG  Tablet, Oral) Active. Nascobal (500MCG/0.1ML Solution, Nasal) Active.  Social History Maurice Johnston(Maurice [name]; 01/27/2014 9:21 AM) Alcohol use Occasional alcohol use. Illicit drug use Remotely quit drug use. No caffeine use Tobacco use Never smoker.  Family History Maurice Johnston(Maurice Johnston, [name]; 01/27/2014 9:21 AM) Alcohol Abuse Father. Breast Cancer Mother. Colon Cancer Father. Colon Polyps Mother. Depression Father. Diabetes Mellitus Daughter. Heart Disease Mother. Hypertension Brother, Mother. Kidney Disease Mother.     Review of Systems Maurice Johnston(Maurice [place] MA; 01/27/2014 9:21 AM) General Present- Fatigue. Not Present- Appetite Loss, Chills, Fever, Night Sweats, Weight Gain and Weight Loss. Skin Not Present- Change in Wart/Mole, Dryness, Hives, Jaundice, New Lesions, Non-Healing Wounds, Rash and Ulcer. HEENT Not Present- Earache, Hearing Loss, Hoarseness, Nose Bleed, Oral Ulcers, Ringing in the Ears, Seasonal Allergies, Sinus Pain, Sore Throat, Visual Disturbances, Wears glasses/contact lenses and Yellow Eyes. Respiratory Present- Snoring. Not Present- Bloody sputum, Chronic Cough, Difficulty Breathing and Wheezing. Breast Not Present- Breast Mass, Breast Pain, Nipple Discharge and Skin Changes. Cardiovascular Not Present- Chest Pain, Difficulty Breathing Lying Down, Leg Cramps, Palpitations, Rapid Heart Rate, Shortness of Breath and Swelling of Extremities. Gastrointestinal Not Present- Abdominal Pain, Bloating, Bloody Stool, Change in Bowel Habits, Chronic diarrhea, Constipation, Difficulty Swallowing, Excessive gas, Gets full quickly at meals, Hemorrhoids, Indigestion, Nausea, Rectal Pain and  Vomiting. Male Genitourinary Not Present- Blood in Urine, Change in Urinary Stream, Frequency, Impotence, Nocturia, Painful Urination, Urgency and Urine Leakage. Musculoskeletal Present- Joint Stiffness. Not Present- Back Pain, Joint Pain, Muscle Pain, Muscle Weakness and Swelling of Extremities. Neurological Not Present- Decreased Memory, Fainting, Headaches,  Numbness, Seizures, Tingling, Tremor, Trouble walking and Weakness. Psychiatric Not Present- Anxiety, Bipolar, Change in Sleep Pattern, Depression, Fearful and Frequent crying. Endocrine Present- Heat Intolerance. Not Present- Cold Intolerance, Excessive Hunger, Hair Changes, Hot flashes and New Diabetes. Hematology Not Present- Easy Bruising, Excessive bleeding, Gland problems, HIV and Persistent Infections.  Vitals Maurice Johnston(Maurice [place] MA; 01/27/2014 9:21 AM) 01/27/2014 9:21 AM Weight: 252.4 lb Height: 70in Body Surface Area: 2.38 m Body Mass Index: 36.22 kg/m Temp.: 60F(Temporal)  Pulse: 66 (Regular)  Resp.: 16 (Unlabored)  BP: 128/80 (Sitting, Left Arm, Standard)     Physical Exam Maurice Johnston(Brinnley Lacap T. Crystalmarie Yasin MD; 01/27/2014 9:31 AM)  The physical exam findings are as follows: Note:General: Alert, obese Caucasian male, in no distress Skin: Warm and dry without rash or infection. HEENT: No palpable masses or thyromegaly. Sclera nonicteric. Pupils equal round and reactive. Lungs: Breath sounds clear and equal. No wheezing or increased work of breathing. Cardiovascular: Regular rate and rhythm without murmer. No JVD or edema. Peripheral pulses intact. No carotid bruits. Abdomen: Nondistended. Soft and nontender. No masses palpable. No organomegaly. No palpable hernias. Extremities: No edema or joint swelling or deformity. No chronic venous stasis changes. Neurologic: Alert and fully oriented. Gait normal. No focal weakness. Psychiatric: Normal mood and affect. Thought content appropriate with normal judgement and  insight    Assessment & Plan Maurice Johnston(Kennan Detter T. Regginald Pask MD; 01/27/2014 9:33 AM)  MORBID OBESITY (278.01  E66.01) Impression: Ready to proceed with planned laparoscopic Roux-en-Y gastric bypass and cholecystectomy with cholangiogram. We reviewed the procedures again today and reviewed the consent form and all his questions were answered. He is given a prescription for pain medication and for his bowel prep.  Current Plans Started OxyCODONE HCl 5MG /5ML, 5-10 Milliliter every four hours, as needed, 200 Milliliter, 01/27/2014, No Refill.

## 2014-01-30 ENCOUNTER — Encounter (HOSPITAL_COMMUNITY)
Admission: RE | Disposition: A | Discharge status: Home / Self Care | Payer: Self-pay | Source: Ambulatory Visit | Attending: General Surgery

## 2014-01-30 ENCOUNTER — Inpatient Hospital Stay (HOSPITAL_COMMUNITY): Payer: BC Managed Care – PPO | Admitting: Anesthesiology

## 2014-01-30 ENCOUNTER — Inpatient Hospital Stay (HOSPITAL_COMMUNITY): Payer: BC Managed Care – PPO

## 2014-01-30 ENCOUNTER — Inpatient Hospital Stay (HOSPITAL_COMMUNITY)
Admission: RE | Admit: 2014-01-30 | Discharge: 2014-02-01 | DRG: 621 | Disposition: A | Discharge status: Home / Self Care | Payer: BC Managed Care – PPO | Source: Ambulatory Visit | Attending: General Surgery | Admitting: General Surgery

## 2014-01-30 ENCOUNTER — Encounter (HOSPITAL_COMMUNITY): Payer: Self-pay | Admitting: *Deleted

## 2014-01-30 DIAGNOSIS — Z01812 Encounter for preprocedural laboratory examination: Secondary | ICD-10-CM

## 2014-01-30 DIAGNOSIS — K8020 Calculus of gallbladder without cholecystitis without obstruction: Secondary | ICD-10-CM | POA: Diagnosis present

## 2014-01-30 DIAGNOSIS — I2510 Atherosclerotic heart disease of native coronary artery without angina pectoris: Secondary | ICD-10-CM | POA: Diagnosis present

## 2014-01-30 DIAGNOSIS — Z800 Family history of malignant neoplasm of digestive organs: Secondary | ICD-10-CM | POA: Diagnosis not present

## 2014-01-30 DIAGNOSIS — Z8249 Family history of ischemic heart disease and other diseases of the circulatory system: Secondary | ICD-10-CM | POA: Diagnosis not present

## 2014-01-30 DIAGNOSIS — Z833 Family history of diabetes mellitus: Secondary | ICD-10-CM

## 2014-01-30 DIAGNOSIS — K219 Gastro-esophageal reflux disease without esophagitis: Secondary | ICD-10-CM | POA: Diagnosis present

## 2014-01-30 DIAGNOSIS — E785 Hyperlipidemia, unspecified: Secondary | ICD-10-CM | POA: Diagnosis present

## 2014-01-30 DIAGNOSIS — Z803 Family history of malignant neoplasm of breast: Secondary | ICD-10-CM

## 2014-01-30 DIAGNOSIS — E780 Pure hypercholesterolemia: Secondary | ICD-10-CM | POA: Diagnosis present

## 2014-01-30 DIAGNOSIS — E119 Type 2 diabetes mellitus without complications: Secondary | ICD-10-CM | POA: Diagnosis present

## 2014-01-30 DIAGNOSIS — Z9884 Bariatric surgery status: Secondary | ICD-10-CM

## 2014-01-30 DIAGNOSIS — E0822 Diabetes mellitus due to underlying condition with diabetic chronic kidney disease: Secondary | ICD-10-CM

## 2014-01-30 DIAGNOSIS — Z794 Long term (current) use of insulin: Secondary | ICD-10-CM

## 2014-01-30 DIAGNOSIS — I10 Essential (primary) hypertension: Secondary | ICD-10-CM | POA: Diagnosis present

## 2014-01-30 DIAGNOSIS — Z6835 Body mass index (BMI) 35.0-35.9, adult: Secondary | ICD-10-CM | POA: Diagnosis not present

## 2014-01-30 HISTORY — PX: GASTRIC ROUX-EN-Y: SHX5262

## 2014-01-30 HISTORY — PX: CHOLECYSTECTOMY: SHX55

## 2014-01-30 LAB — HEMOGLOBIN AND HEMATOCRIT, BLOOD
HCT: 43.9 % (ref 39.0–52.0)
HEMOGLOBIN: 14.6 g/dL (ref 13.0–17.0)

## 2014-01-30 LAB — GLUCOSE, CAPILLARY
GLUCOSE-CAPILLARY: 198 mg/dL — AB (ref 70–99)
GLUCOSE-CAPILLARY: 163 mg/dL — AB (ref 70–99)
GLUCOSE-CAPILLARY: 163 mg/dL — AB (ref 70–99)
Glucose-Capillary: 84 mg/dL (ref 70–99)
Glucose-Capillary: 173 mg/dL — ABNORMAL HIGH (ref 70–99)
Glucose-Capillary: 194 mg/dL — ABNORMAL HIGH (ref 70–99)

## 2014-01-30 SURGERY — LAPAROSCOPIC ROUX-EN-Y GASTRIC BYPASS WITH UPPER ENDOSCOPY
Anesthesia: General | Site: Abdomen

## 2014-01-30 MED ORDER — HEPARIN SODIUM (PORCINE) 5000 UNIT/ML IJ SOLN
5000.0000 [IU] | INTRAMUSCULAR | Status: AC
  Administered 2014-01-30: 5000 [IU] via SUBCUTANEOUS
  Filled 2014-01-30: qty 1

## 2014-01-30 MED ORDER — UNJURY CHICKEN SOUP POWDER: 2.0000 [oz_av] | Freq: Four times a day (QID) | ORAL | Status: DC

## 2014-01-30 MED ORDER — HYDROMORPHONE HCL 1 MG/ML IJ SOLN
0.2500 mg | INTRAMUSCULAR | Status: DC | PRN
  Administered 2014-01-30 (×2): 0.5 mg via INTRAVENOUS

## 2014-01-30 MED ORDER — HEPARIN SODIUM (PORCINE) 5000 UNIT/ML IJ SOLN
5000.0000 [IU] | Freq: Three times a day (TID) | INTRAMUSCULAR | Status: DC
  Administered 2014-01-30 – 2014-02-01 (×5): 5000 [IU] via SUBCUTANEOUS
  Filled 2014-01-30 (×8): qty 1

## 2014-01-30 MED ORDER — PROMETHAZINE HCL 25 MG/ML IJ SOLN
12.5000 mg | INTRAMUSCULAR | Status: DC | PRN
  Administered 2014-01-30 – 2014-01-31 (×6): 12.5 mg via INTRAVENOUS
  Filled 2014-01-30 (×6): qty 1

## 2014-01-30 MED ORDER — LACTATED RINGERS IV SOLN
INTRAVENOUS | Status: DC | PRN
  Administered 2014-01-30: 1000 mL via INTRAVENOUS

## 2014-01-30 MED ORDER — TISSEEL VH 10 ML EX KIT
PACK | CUTANEOUS | Status: AC
  Filled 2014-01-30: qty 2

## 2014-01-30 MED ORDER — HYDROMORPHONE HCL 2 MG/ML IJ SOLN
INTRAMUSCULAR | Status: AC
  Filled 2014-01-30: qty 1

## 2014-01-30 MED ORDER — DEXTROSE 5 % IV SOLN
2.0000 g | INTRAVENOUS | Status: AC
  Administered 2014-01-30 (×2): 2 g via INTRAVENOUS

## 2014-01-30 MED ORDER — INSULIN ASPART 100 UNIT/ML ~~LOC~~ SOLN
0.0000 [IU] | Freq: Three times a day (TID) | SUBCUTANEOUS | Status: DC
  Administered 2014-01-30: 3 [IU] via SUBCUTANEOUS
  Administered 2014-01-31: 2 [IU] via SUBCUTANEOUS
  Administered 2014-01-31: 3 [IU] via SUBCUTANEOUS
  Administered 2014-01-31 – 2014-02-01 (×2): 2 [IU] via SUBCUTANEOUS

## 2014-01-30 MED ORDER — ROCURONIUM BROMIDE 100 MG/10ML IV SOLN
INTRAVENOUS | Status: DC | PRN
  Administered 2014-01-30: 30 mg via INTRAVENOUS
  Administered 2014-01-30: 20 mg via INTRAVENOUS
  Administered 2014-01-30: 30 mg via INTRAVENOUS

## 2014-01-30 MED ORDER — PROMETHAZINE HCL 25 MG/ML IJ SOLN: 6.2500 mg | INTRAMUSCULAR | Status: DC | PRN

## 2014-01-30 MED ORDER — ONDANSETRON HCL 4 MG/2ML IJ SOLN
INTRAMUSCULAR | Status: AC
  Filled 2014-01-30: qty 2

## 2014-01-30 MED ORDER — SUCCINYLCHOLINE CHLORIDE 20 MG/ML IJ SOLN
INTRAMUSCULAR | Status: DC | PRN
  Administered 2014-01-30: 160 mg via INTRAVENOUS

## 2014-01-30 MED ORDER — ACETAMINOPHEN 160 MG/5ML PO SOLN: 650.0000 mg | ORAL | Status: DC | PRN

## 2014-01-30 MED ORDER — LACTATED RINGERS IV SOLN
INTRAVENOUS | Status: DC
  Administered 2014-01-30: 1 via INTRAVENOUS

## 2014-01-30 MED ORDER — TISSEEL VH 10 ML EX KIT
PACK | CUTANEOUS | Status: DC | PRN
  Administered 2014-01-30: 10 mL

## 2014-01-30 MED ORDER — NITROGLYCERIN 0.4 MG SL SUBL: 0.4000 mg | SUBLINGUAL_TABLET | SUBLINGUAL | Status: DC | PRN

## 2014-01-30 MED ORDER — METOPROLOL TARTRATE 1 MG/ML IV SOLN
5.0000 mg | Freq: Four times a day (QID) | INTRAVENOUS | Status: DC
  Administered 2014-01-30 – 2014-02-01 (×6): 5 mg via INTRAVENOUS
  Filled 2014-01-30 (×12): qty 5

## 2014-01-30 MED ORDER — ROCURONIUM BROMIDE 100 MG/10ML IV SOLN
INTRAVENOUS | Status: AC
  Filled 2014-01-30: qty 1

## 2014-01-30 MED ORDER — EPHEDRINE SULFATE 50 MG/ML IJ SOLN
INTRAMUSCULAR | Status: AC
  Filled 2014-01-30: qty 1

## 2014-01-30 MED ORDER — BUPIVACAINE-EPINEPHRINE 0.25% -1:200000 IJ SOLN
INTRAMUSCULAR | Status: AC
  Filled 2014-01-30: qty 1

## 2014-01-30 MED ORDER — GLYCOPYRROLATE 0.2 MG/ML IJ SOLN
INTRAMUSCULAR | Status: DC | PRN
  Administered 2014-01-30: .6 mg via INTRAVENOUS

## 2014-01-30 MED ORDER — CHLORHEXIDINE GLUCONATE CLOTH 2 % EX PADS: 6.0000 | MEDICATED_PAD | Freq: Once | CUTANEOUS | Status: DC

## 2014-01-30 MED ORDER — ONDANSETRON HCL 4 MG/2ML IJ SOLN
4.0000 mg | INTRAMUSCULAR | Status: DC | PRN
  Administered 2014-01-30: 4 mg via INTRAVENOUS
  Filled 2014-01-30: qty 2

## 2014-01-30 MED ORDER — NEOSTIGMINE METHYLSULFATE 10 MG/10ML IV SOLN
INTRAVENOUS | Status: DC | PRN
  Administered 2014-01-30: 4 mg via INTRAVENOUS

## 2014-01-30 MED ORDER — MIDAZOLAM HCL 5 MG/5ML IJ SOLN
INTRAMUSCULAR | Status: DC | PRN
  Administered 2014-01-30: 2 mg via INTRAVENOUS

## 2014-01-30 MED ORDER — HYDROMORPHONE HCL 1 MG/ML IJ SOLN
INTRAMUSCULAR | Status: AC
  Filled 2014-01-30: qty 1

## 2014-01-30 MED ORDER — ACETAMINOPHEN 160 MG/5ML PO SOLN: 325.0000 mg | ORAL | Status: DC | PRN

## 2014-01-30 MED ORDER — LACTATED RINGERS IV SOLN
INTRAVENOUS | Status: DC | PRN
  Administered 2014-01-30 (×2): via INTRAVENOUS

## 2014-01-30 MED ORDER — ALBUTEROL SULFATE (2.5 MG/3ML) 0.083% IN NEBU: 2.5000 mg | INHALATION_SOLUTION | RESPIRATORY_TRACT | Status: DC | PRN

## 2014-01-30 MED ORDER — MORPHINE SULFATE 2 MG/ML IJ SOLN
2.0000 mg | INTRAMUSCULAR | Status: DC | PRN
  Administered 2014-01-30 (×2): 4 mg via INTRAVENOUS
  Administered 2014-01-30 – 2014-01-31 (×2): 2 mg via INTRAVENOUS
  Administered 2014-01-31: 4 mg via INTRAVENOUS
  Filled 2014-01-30: qty 1
  Filled 2014-01-30 (×3): qty 2
  Filled 2014-01-30: qty 1

## 2014-01-30 MED ORDER — FENTANYL CITRATE 0.05 MG/ML IJ SOLN
INTRAMUSCULAR | Status: DC | PRN
  Administered 2014-01-30: 150 ug via INTRAVENOUS
  Administered 2014-01-30: 100 ug via INTRAVENOUS

## 2014-01-30 MED ORDER — INSULIN GLARGINE 100 UNIT/ML ~~LOC~~ SOLN
15.0000 [IU] | Freq: Every day | SUBCUTANEOUS | Status: DC
  Administered 2014-01-30 – 2014-01-31 (×2): 15 [IU] via SUBCUTANEOUS
  Filled 2014-01-30 (×3): qty 0.15

## 2014-01-30 MED ORDER — UNJURY CHOCOLATE CLASSIC POWDER
2.0000 [oz_av] | Freq: Four times a day (QID) | ORAL | Status: DC
  Administered 2014-02-01: 2 [oz_av] via ORAL

## 2014-01-30 MED ORDER — EPHEDRINE SULFATE 50 MG/ML IJ SOLN
INTRAMUSCULAR | Status: DC | PRN
  Administered 2014-01-30: 10 mg via INTRAVENOUS

## 2014-01-30 MED ORDER — POTASSIUM CHLORIDE IN NACL 20-0.9 MEQ/L-% IV SOLN
INTRAVENOUS | Status: DC
  Administered 2014-01-30 – 2014-02-01 (×5): via INTRAVENOUS
  Filled 2014-01-30 (×7): qty 1000

## 2014-01-30 MED ORDER — DEXTROSE 5 % IV SOLN
INTRAVENOUS | Status: AC
  Filled 2014-01-30: qty 2

## 2014-01-30 MED ORDER — MIDAZOLAM HCL 2 MG/2ML IJ SOLN
INTRAMUSCULAR | Status: AC
  Filled 2014-01-30: qty 2

## 2014-01-30 MED ORDER — UNJURY VANILLA POWDER: 2.0000 [oz_av] | Freq: Four times a day (QID) | ORAL | Status: DC

## 2014-01-30 MED ORDER — ONDANSETRON HCL 4 MG/2ML IJ SOLN
INTRAMUSCULAR | Status: DC | PRN
  Administered 2014-01-30: 4 mg via INTRAVENOUS

## 2014-01-30 MED ORDER — LIDOCAINE HCL (CARDIAC) 20 MG/ML IV SOLN
INTRAVENOUS | Status: DC | PRN
  Administered 2014-01-30: 50 mg via INTRAVENOUS

## 2014-01-30 MED ORDER — CETYLPYRIDINIUM CHLORIDE 0.05 % MT LIQD
7.0000 mL | Freq: Two times a day (BID) | OROMUCOSAL | Status: DC
  Administered 2014-01-30 – 2014-01-31 (×2): 7 mL via OROMUCOSAL

## 2014-01-30 MED ORDER — CHLORHEXIDINE GLUCONATE 0.12 % MT SOLN
15.0000 mL | Freq: Two times a day (BID) | OROMUCOSAL | Status: DC
  Administered 2014-01-31 – 2014-02-01 (×2): 15 mL via OROMUCOSAL
  Filled 2014-01-30 (×6): qty 15

## 2014-01-30 MED ORDER — PROPOFOL 10 MG/ML IV BOLUS
INTRAVENOUS | Status: DC | PRN
  Administered 2014-01-30: 200 mg via INTRAVENOUS

## 2014-01-30 MED ORDER — FENTANYL CITRATE 0.05 MG/ML IJ SOLN
INTRAMUSCULAR | Status: AC
  Filled 2014-01-30: qty 5

## 2014-01-30 MED ORDER — LIDOCAINE HCL (CARDIAC) 20 MG/ML IV SOLN
INTRAVENOUS | Status: AC
  Filled 2014-01-30: qty 5

## 2014-01-30 MED ORDER — HYDROMORPHONE HCL 1 MG/ML IJ SOLN
INTRAMUSCULAR | Status: DC | PRN
  Administered 2014-01-30 (×2): 0.5 mg via INTRAVENOUS
  Administered 2014-01-30 (×2): .5 mg via INTRAVENOUS

## 2014-01-30 MED ORDER — OXYCODONE HCL 5 MG/5ML PO SOLN
5.0000 mg | ORAL | Status: DC | PRN
  Administered 2014-01-31 (×2): 5 mg via ORAL
  Filled 2014-01-30: qty 25
  Filled 2014-01-30 (×3): qty 5

## 2014-01-30 MED ORDER — PROPOFOL 10 MG/ML IV BOLUS
INTRAVENOUS | Status: AC
  Filled 2014-01-30: qty 20

## 2014-01-30 SURGICAL SUPPLY — 79 items
APPLICATOR COTTON TIP 6IN STRL (MISCELLANEOUS) ×4 IMPLANT
APPLIER CLIP ROT 13.4 12 LRG (CLIP) ×2
BLADE SURG SZ11 CARB STEEL (BLADE) ×2 IMPLANT
CABLE HIGH FREQUENCY MONO STRZ (ELECTRODE) ×2 IMPLANT
CANISTER SUCTION 2500CC (MISCELLANEOUS) ×2 IMPLANT
CATH REDDICK CHOLANGI 4FR 50CM (CATHETERS) IMPLANT
CHLORAPREP W/TINT 26ML (MISCELLANEOUS) ×2 IMPLANT
CLIP APPLIE ROT 13.4 12 LRG (CLIP) ×1 IMPLANT
CLIP SUT LAPRA TY ABSORB (SUTURE) ×4 IMPLANT
COVER MAYO STAND STRL (DRAPES) ×2 IMPLANT
CUTTER FLEX LINEAR 45M (STAPLE) ×2 IMPLANT
DECANTER SPIKE VIAL GLASS SM (MISCELLANEOUS) ×2 IMPLANT
DEVICE SUTURE ENDOST 10MM (ENDOMECHANICALS) IMPLANT
DISSECTOR BLUNT TIP ENDO 5MM (MISCELLANEOUS) IMPLANT
DRAIN PENROSE 18X1/4 LTX STRL (WOUND CARE) ×2 IMPLANT
DRAPE C-ARM 42X120 X-RAY (DRAPES) ×2 IMPLANT
DRAPE CAMERA CLOSED 9X96 (DRAPES) ×2 IMPLANT
DRAPE UTILITY XL STRL (DRAPES) ×2 IMPLANT
ELECT REM PT RETURN 9FT ADLT (ELECTROSURGICAL) ×2
ELECTRODE REM PT RTRN 9FT ADLT (ELECTROSURGICAL) ×1 IMPLANT
GAUZE SPONGE 4X4 12PLY STRL (GAUZE/BANDAGES/DRESSINGS) ×2 IMPLANT
GAUZE SPONGE 4X4 16PLY XRAY LF (GAUZE/BANDAGES/DRESSINGS) ×2 IMPLANT
GLOVE BIOGEL PI IND STRL 7.5 (GLOVE) ×1 IMPLANT
GLOVE BIOGEL PI INDICATOR 7.5 (GLOVE) ×1
GLOVE SS BIOGEL STRL SZ 7.5 (GLOVE) ×1 IMPLANT
GLOVE SUPERSENSE BIOGEL SZ 7.5 (GLOVE) ×1
GOWN STRL REUS W/TWL XL LVL3 (GOWN DISPOSABLE) ×4 IMPLANT
HEMOSTAT SNOW SURGICEL 2X4 (HEMOSTASIS) IMPLANT
HEMOSTAT SURGICEL 4X8 (HEMOSTASIS) IMPLANT
HOVERMATT SINGLE USE (MISCELLANEOUS) ×2 IMPLANT
KIT BASIN OR (CUSTOM PROCEDURE TRAY) ×2 IMPLANT
KIT GASTRIC LAVAGE 34FR ADT (SET/KITS/TRAYS/PACK) ×2 IMPLANT
LIQUID BAND (GAUZE/BANDAGES/DRESSINGS) ×2 IMPLANT
NEEDLE SPNL 22GX3.5 QUINCKE BK (NEEDLE) ×2 IMPLANT
NS IRRIG 1000ML POUR BTL (IV SOLUTION) IMPLANT
PACK CARDIOVASCULAR III (CUSTOM PROCEDURE TRAY) ×2 IMPLANT
PEN SKIN MARKING BROAD (MISCELLANEOUS) ×2 IMPLANT
POUCH SPECIMEN RETRIEVAL 10MM (ENDOMECHANICALS) IMPLANT
RELOAD 45 VASCULAR/THIN (ENDOMECHANICALS) ×2 IMPLANT
RELOAD ENDO STITCH 2.0 (ENDOMECHANICALS)
RELOAD STAPLE TA45 3.5 REG BLU (ENDOMECHANICALS) ×4 IMPLANT
RELOAD STAPLER BLUE 60MM (STAPLE) ×3 IMPLANT
RELOAD STAPLER GOLD 60MM (STAPLE) ×1 IMPLANT
RELOAD STAPLER WHITE 60MM (STAPLE) ×1 IMPLANT
SCISSORS LAP 5X35 DISP (ENDOMECHANICALS) ×2 IMPLANT
SCISSORS LAP 5X45 EPIX DISP (ENDOMECHANICALS) ×2 IMPLANT
SEALANT SURGICAL APPL DUAL CAN (MISCELLANEOUS) ×2 IMPLANT
SET CHOLANGIOGRAPH MIX (MISCELLANEOUS) ×2 IMPLANT
SET IRRIG TUBING LAPAROSCOPIC (IRRIGATION / IRRIGATOR) ×2 IMPLANT
SHEARS CURVED HARMONIC AC 45CM (MISCELLANEOUS) ×2 IMPLANT
SLEEVE ADV FIXATION 12X100MM (TROCAR) ×4 IMPLANT
SOLUTION ANTI FOG 6CC (MISCELLANEOUS) ×2 IMPLANT
STAPLE ECHEON FLEX 60 POW ENDO (STAPLE) IMPLANT
STAPLER ECHELON LONG 60 440 (INSTRUMENTS) ×2 IMPLANT
STAPLER RELOAD BLUE 60MM (STAPLE) ×6
STAPLER RELOAD GOLD 60MM (STAPLE) ×2
STAPLER RELOAD WHITE 60MM (STAPLE) ×2
STAPLER VISISTAT 35W (STAPLE) ×2 IMPLANT
SUT DEVICE BRAIDED 0X39 (SUTURE) IMPLANT
SUT DVC SILK 2.0X39 (SUTURE) ×10 IMPLANT
SUT DVC VICRYL PGA 2.0X39 (SUTURE) ×10 IMPLANT
SUT MNCRL AB 4-0 PS2 18 (SUTURE) ×2 IMPLANT
SUT RELOAD ENDO STITCH 2 48X1 (ENDOMECHANICALS)
SUT RELOAD ENDO STITCH 2.0 (ENDOMECHANICALS)
SUT VIC AB 2-0 SH 27 (SUTURE) ×1
SUT VIC AB 2-0 SH 27X BRD (SUTURE) ×1 IMPLANT
SUTURE RELOAD END STTCH 2 48X1 (ENDOMECHANICALS) IMPLANT
SUTURE RELOAD ENDO STITCH 2.0 (ENDOMECHANICALS) IMPLANT
SYR 20CC LL (SYRINGE) ×6 IMPLANT
SYR 50ML LL SCALE MARK (SYRINGE) ×2 IMPLANT
TOWEL OR 17X26 10 PK STRL BLUE (TOWEL DISPOSABLE) ×2 IMPLANT
TOWEL OR NON WOVEN STRL DISP B (DISPOSABLE) ×2 IMPLANT
TRAY FOLEY CATH 14FRSI W/METER (CATHETERS) ×2 IMPLANT
TROCAR ADV FIXATION 12X100MM (TROCAR) ×2 IMPLANT
TROCAR ADV FIXATION 5X100MM (TROCAR) ×2 IMPLANT
TROCAR BLADELESS OPT 5 100 (ENDOMECHANICALS) ×2 IMPLANT
TROCAR XCEL 12X100 BLDLESS (ENDOMECHANICALS) ×2 IMPLANT
TUBING ENDO SMARTCAP PENTAX (MISCELLANEOUS) ×2 IMPLANT
TUBING FILTER THERMOFLATOR (ELECTROSURGICAL) ×2 IMPLANT

## 2014-01-30 NOTE — Plan of Care (Signed)
Problem: Phase I Progression Outcomes Goal: Voiding-avoid urinary catheter unless indicated Outcome: Completed/Met Date Met:  01/30/14     

## 2014-01-30 NOTE — Op Note (Signed)
Maurice CapriceRobert Fowles [id number] 08/26/1960 01/30/2014  Preoperative diagnosis: morbid obesity  Postoperative diagnosis: Same   Procedure: Upper endoscopy   Surgeon: Atilano InaEric M Meisha Salone M.D., FACS   Anesthesia: Gen.   Indications for procedure: 53 yo WM undergoing a laparoscopic roux en y gastric bypass and an upper endoscopy was requested to evaluate the anastomosis.  Description of procedure: After we have completed the new gastrojejunostomy, I scrubbed out and obtained the Olympus endoscope. I gently placed endoscope in the patient's oropharynx and gently glided it down the esophagus without any difficulty under direct visualization. Once I was in the gastric pouch, I insufflated the pouch was air. The pouch was approximately 4.5 cm in size. I was able to cannulate and advanced the scope through the gastrojejunostomy. Dr.Hoxworth had placed saline in the upper abdomen. Upon further insufflation of the gastric pouch there was no evidence of bubbles. Upon further inspection of the gastric pouch, the mucosa appeared normal. There is no evidence of any mucosal abnormality. The gastric pouch and Roux limb were decompressed. The width of the gastrojejunal anastomosis was at least 2 cm. The scope was withdrawn. The patient tolerated this portion of the procedure well. Please see Dr Johna SheriffHoxworth operative note for details regarding the laparoscopic roux-en-y gastric bypass.  Mary SellaEric M. Andrey CampanileWilson, MD, FACS General, Bariatric, & Minimally Invasive Surgery [facility], [place]

## 2014-01-30 NOTE — Interval H&P Note (Signed)
History and Physical Interval Note:  01/30/2014 7:06 AM  Maurice Johnston  has presented today for surgery, with the diagnosis of Morbid Obesity  The various methods of treatment have been discussed with the patient and family. After consideration of risks, benefits and other options for treatment, the patient has consented to  Procedure(s): LAPAROSCOPIC ROUX-EN-Y GASTRIC BYPASS WITH UPPER ENDOSCOPY (N/A) LAPAROSCOPIC CHOLECYSTECTOMY WITH INTRAOPERATIVE CHOLANGIOGRAM (N/A) as a surgical intervention .  The patient's history has been reviewed, patient examined, no change in status, stable for surgery.  I have reviewed the patient's chart and labs.  Questions were answered to the patient's satisfaction.     [name] T

## 2014-01-30 NOTE — Transfer of Care (Signed)
Immediate Anesthesia Transfer of Care Note  Patient: Maurice Johnston  Procedure(s) Performed: Procedure(s): LAPAROSCOPIC ROUX-EN-Y GASTRIC BYPASS WITH UPPER ENDOSCOPY (N/A) LAPAROSCOPIC CHOLECYSTECTOMY WITH INTRAOPERATIVE CHOLANGIOGRAM (N/A)  Patient Location: PACU  Anesthesia Type:General  Level of Consciousness: awake, alert  and oriented  Airway & Oxygen Therapy: Patient Spontanous Breathing and Patient connected to face mask oxygen  Post-op Assessment: Report given to PACU RN and Post -op Vital signs reviewed and stable  Post vital signs: Reviewed and stable  Complications: No apparent anesthesia complications

## 2014-01-30 NOTE — Progress Notes (Signed)
Pt has reddened area buttocks, present on arrival

## 2014-01-30 NOTE — Plan of Care (Signed)
Problem: Consults Goal: Gastric Bypass Patient Education See Patient Education Module for education specifics. Outcome: Completed/Met Date Met:  01/30/14 Goal: Skin Care Protocol Initiated - if Braden Score 18 or less If consults are not indicated, leave blank or document N/A Outcome: Not Applicable Date Met:  12/82/08 Goal: Nutrition Consult-if indicated Outcome: Completed/Met Date Met:  01/30/14 Goal: Diabetes Guidelines if Diabetic/Glucose > 140 If diabetic or lab glucose is > 140 mg/dl - Initiate Diabetes/Hyperglycemia Guidelines & Document Interventions  Outcome: Completed/Met Date Met:  01/30/14  Problem: Phase I Progression Outcomes Goal: Pain controlled with appropriate interventions Outcome: Completed/Met Date Met:  01/30/14 Goal: OOB as tolerated unless otherwise ordered Outcome: Completed/Met Date Met:  01/30/14 Goal: CPAP/BI-PAP utilized with sleeping per order Outcome: Not Applicable Date Met:  13/88/71

## 2014-01-30 NOTE — Op Note (Signed)
Preop diagnosis: Morbid obesity  Postop diagnosis: Morbid obesity  Body mass index is 35.8 kg/(m^2).  Surgical procedure: Laparoscopic Roux-en-Y gastric bypass, Laparoscopic cholecystectomy with intraoperative cholangiogram  Surgeon: Sharlet SalinaBenjamin T.Myiesha Edgar M.D.  Asst.: Gaynelle AduEric Wilson M.D.  Anesthesia: General  Complications:  None  EBL: Minimal  Drains: None  Disposition: PACU in good condition  Description of procedure: Patient is brought to the operating room and general anesthesia induced. She had received preoperative broad-spectrum IV antibiotics and subcutaneous heparin. The abdomen was widely sterilely prepped and draped. Patient timeout was performed and correct patient and procedure confirmed. Access was obtained with a 12 mm Optiview trocar in the left upper quadrant and pneumoperitoneum established without difficulty. Under direct vision 12 mm trocars were placed laterally in the right upper quadrant, right upper quadrant midclavicular line, and to the left and above the umbilicus for the camera port. A 5 mm trocar was placed laterally in the left upper quadrant. The omentum was brought into the upper abdomen and the transverse mesocolon elevated and the ligament of Treitz clearly identified. A 40 cm biliopancreatic limb was then carefully measured from the ligament of Treitz. The small intestine was divided at this point with a single firing of the white load linear stapler. A Penrose drain was sutured to the end of the Roux-en-Y limb for later identification. A 100 cm Roux-en-Y limb was then carefully measured. At this point a side-to-side anastomosis was created between the Roux limb and the end of the biliopancreatic limb. This was accomplished with a single firing of the 45 mm white load linear stapler. The common enterotomy was closed with a running 2-0 Vicryl begun at either end of the enterotomy and tied centrally. The mesenteric defect was then closed with running 2-0 silk. The  omentum was then divided with the harmonic scalpel up towards the transverse colon to allow mobility of the Roux limb toward the gastric pouch. The patient was then placed in steep reversed Trendelenburg. Through a 5 mm subxiphoid site the [facility] retractor was placed and the left lobe of the liver elevated with excellent exposure of the upper stomach and hiatus. The angle of Hiss was then mobilized with the harmonic scalpel. A 4 cm gastric pouch was then carefully measured along the lesser curve of the stomach. Dissection was carried along the lesser curve at this point with the Harmonic scalpel working carefully back toward the lesser sac at right angles to the lesser curve. The free lesser sac was then entered. After being sure all tubes were removed from the stomach an initial firing of the gold load 60 mm linear stapler was fired at right angles across the lesser curve for about 4 cm. The gastric pouch was further mobilized posteriorly and then the pouch was completed with 2 further firings of the 60 mm blue load linear stapler up through the previously dissected angle of His. It was ensured that the pouch was completely mobilized away from the gastric remnant. This created a nice tubular 4-5 cm gastric pouch. The staple line of the gastric remnant was then oversewn with 2-0 silk for hemostasis. The Roux limb was then brought up in an antecolic fashion with the candycane facing to the patient's left without undue tension. The gastrojejunostomy was created with an initial posterior row of 2-0 Vicryl between the Roux limb and the staple line of the gastric pouch. Enterotomies were then made in the gastric pouch and the Roux limb with the harmonic scalpel and at approximately 2-2-1/2 cm anastomosis  was created with a single firing of the blue load linear stapler. The staple line was inspected and was intact without bleeding. The common enterotomy was then closed with running 2-0 Vicryl begun at either end and  tied centrally. The wall tube was then easily passed through the anastomosis and an outer anterior layer of running 2-0 Vicryl was placed. The Ewald tube was removed. With the outlet of the gastrojejunostomy clamped and under saline irrigation the assistant performed upper endoscopy and with the gastric pouch tensely distended with air there was no evidence of leak. The pouch was desufflated. The Vonita Mosseterson defect was closed with running 2-0 silk. The abdomen was inspected for any evidence of bleeding or bowel injury and everything looked fine. The anastomosis was coated with Tisseel tissue sealant. Attention was then turned to the cholecystectomy. The Nathanson retractor was removed and replaced with a 5 mm trocar. The gallbladder was exposed and the fundus elevated. Omental adhesions were taken down with the Harmonic scalpel and the infundibulum exposed and retracted inferolaterally. Peritoneum anterior and posterior close triangle was incised and fibrofatty tissue stripped off the neck of the gallbladder toward the porta hepatis. The cystic artery was clearly identified coursing up on the gallbladder wall and was dissected free and divided between 2 proximal and 1 distal clip. The cystic duct was then dissected at the gallbladder junction 360 and dissected out for about a centimeter. When the anatomy was clear the cystic duct was clipped at the gallbladder junction and an operative cholangiogram obtained. This showed good filling of a normal sized common bile duct and intrahepatic ducts with free flow into the duodenum and no filling defects. Following this the [facility] cath was removed and the cystic duct was doubly clipped proximally and divided. The gallbladder was then dissected free from its bed using hook cautery and removed intact and placed in a bag and brought out through one of the 12 mm trocar sites. Hemostasis was assured. All CO2 was evacuated and trochars removed. Skin incisions were closed with  subcuticular Monocryl and Dermabond. Sponge needle and instrument counts were correct.     Mariella SaaBenjamin T Shambhavi Salley MD, FACS  01/30/2014, 10:45 AM

## 2014-01-30 NOTE — Anesthesia Postprocedure Evaluation (Signed)
  Anesthesia Post-op Note  Patient: Maurice Johnston  Procedure(s) Performed: Procedure(s) (LRB): LAPAROSCOPIC ROUX-EN-Y GASTRIC BYPASS WITH UPPER ENDOSCOPY (N/A) LAPAROSCOPIC CHOLECYSTECTOMY WITH INTRAOPERATIVE CHOLANGIOGRAM (N/A)  Patient Location: PACU  Anesthesia Type: General  Level of Consciousness: awake and alert   Airway and Oxygen Therapy: Patient Spontanous Breathing  Post-op Pain: mild  Post-op Assessment: Post-op Vital signs reviewed, Patient's Cardiovascular Status Stable, Respiratory Function Stable, Patent Airway and No signs of Nausea or vomiting  Last Vitals:  Filed Vitals:   01/30/14 1115  BP: 153/70  Pulse: 60  Temp:   Resp: 12    Post-op Vital Signs: stable   Complications: No apparent anesthesia complications

## 2014-01-31 ENCOUNTER — Inpatient Hospital Stay (HOSPITAL_COMMUNITY): Payer: BC Managed Care – PPO

## 2014-01-31 ENCOUNTER — Encounter (HOSPITAL_COMMUNITY): Payer: Self-pay | Admitting: General Surgery

## 2014-01-31 LAB — COMPREHENSIVE METABOLIC PANEL
ALT: 89 U/L — ABNORMAL HIGH (ref 0–53)
AST: 54 U/L — ABNORMAL HIGH (ref 0–37)
Albumin: 3.6 g/dL (ref 3.5–5.2)
Alkaline Phosphatase: 60 U/L (ref 39–117)
Anion gap: 16 — ABNORMAL HIGH (ref 5–15)
BUN: 13 mg/dL (ref 6–23)
CALCIUM: 9 mg/dL (ref 8.4–10.5)
CO2: 23 mEq/L (ref 19–32)
Chloride: 100 mEq/L (ref 96–112)
Creatinine, Ser: 0.78 mg/dL (ref 0.50–1.35)
GFR calc non Af Amer: >90 mL/min (ref >90)
GLUCOSE: 136 mg/dL — AB (ref 70–99)
Potassium: 4.3 mEq/L (ref 3.7–5.3)
SODIUM: 139 meq/L (ref 137–147)
Total Bilirubin: 0.7 mg/dL (ref 0.3–1.2)
Total Protein: 6.8 g/dL (ref 6.0–8.3)

## 2014-01-31 LAB — GLUCOSE, CAPILLARY
Glucose-Capillary: 155 mg/dL — ABNORMAL HIGH (ref 70–99)
Glucose-Capillary: 135 mg/dL — ABNORMAL HIGH (ref 70–99)
Glucose-Capillary: 121 mg/dL — ABNORMAL HIGH (ref 70–99)
Glucose-Capillary: 125 mg/dL — ABNORMAL HIGH (ref 70–99)

## 2014-01-31 LAB — CBC WITH DIFFERENTIAL/PLATELET
Basophils Absolute: 0 10*3/uL (ref 0.0–0.1)
Basophils Relative: 0 % (ref 0–1)
EOS ABS: 0 10*3/uL (ref 0.0–0.7)
EOS PCT: 0 % (ref 0–5)
HCT: 40.7 % (ref 39.0–52.0)
Hemoglobin: 14.3 g/dL (ref 13.0–17.0)
LYMPHS ABS: 1.3 10*3/uL (ref 0.7–4.0)
Lymphocytes Relative: 10 % — ABNORMAL LOW (ref 12–46)
MCH: 29.4 pg (ref 26.0–34.0)
MCHC: 35.1 g/dL (ref 30.0–36.0)
MCV: 83.7 fL (ref 78.0–100.0)
Monocytes Absolute: 0.8 10*3/uL (ref 0.1–1.0)
Monocytes Relative: 6 % (ref 3–12)
Neutro Abs: 10.2 10*3/uL — ABNORMAL HIGH (ref 1.7–7.7)
Neutrophils Relative %: 84 % — ABNORMAL HIGH (ref 43–77)
PLATELETS: 157 10*3/uL (ref 150–400)
RBC: 4.86 MIL/uL (ref 4.22–5.81)
RDW: 13.5 % (ref 11.5–15.5)
WBC: 12.3 10*3/uL — ABNORMAL HIGH (ref 4.0–10.5)

## 2014-01-31 LAB — HEMOGLOBIN AND HEMATOCRIT, BLOOD
HCT: 40.2 % (ref 39.0–52.0)
Hemoglobin: 13.4 g/dL (ref 13.0–17.0)

## 2014-01-31 MED ORDER — IOHEXOL 300 MG/ML  SOLN
50.0000 mL | Freq: Once | INTRAMUSCULAR | Status: AC | PRN
  Administered 2014-01-31: 10 mL via ORAL

## 2014-01-31 NOTE — Plan of Care (Signed)
Problem: Phase II Progression Outcomes Goal: Upper GI SWALLOW ACCEPTABLE Outcome: Completed/Met Date Met:  01/31/14

## 2014-01-31 NOTE — Plan of Care (Signed)
Problem: Phase I Progression Outcomes Goal: Diet - NPO Outcome: Completed/Met Date Met:  01/31/14     

## 2014-01-31 NOTE — Care Management Note (Signed)
    Page 1 of 2   01/31/2014     3:05:03 PM CARE MANAGEMENT NOTE 01/31/2014  Patient:  Maurice Johnston,Maurice Johnston   Account Number:  [id number]  Date Initiated:  01/31/2014  Documentation initiated by:  Dvonte Gatliff  Subjective/Objective Assessment:   Surgical procedure: Laparoscopic Roux-en-Y gastric bypass, Laparoscopic cholecystectomy with intraoperative cholangiogram     Action/Plan:   homew when stable   Anticipated DC Date:  02/03/2014   Anticipated DC Plan:  HOME/SELF CARE  In-house referral  NA      DC Planning Services  CM consult      PAC Choice  NA   Choice offered to / List presented to:  NA   DME arranged  NA      DME agency  NA     HH arranged  NA      HH agency  NA   Status of service:  In process, will continue to follow Medicare Important Message given?   (If response is "NO", the following Medicare IM given date fields will be blank) Date Medicare IM given:   Medicare IM given by:   Date Additional Medicare IM given:   Additional Medicare IM given by:    Discharge Disposition:    Per UR Regulation:  Reviewed for med. necessity/level of care/duration of stay  If discussed at Long Length of Stay Meetings, dates discussed:    Comments:  [id number]/Darsi Tien Earlene Plateravis, RN, BSN, CCM Chart reviewed. Discharge needs and patient's stay to be reviewed and followed by case manager. Chart note for progression of stay: Assessment/Plan: s/p Procedure(s): LAPAROSCOPIC ROUX-EN-Y GASTRIC BYPASS WITH UPPER ENDOSCOPY LAPAROSCOPIC CHOLECYSTECTOMY WITH INTRAOPERATIVE CHOLANGIOGRAM Doing well postoperatively without apparent complication. Begin Postoperative day #1 diet.

## 2014-01-31 NOTE — Plan of Care (Signed)
Problem: Phase I Progression Outcomes Goal: Operative site clean or minimal drainage Outcome: Completed/Met Date Met:  01/31/14     

## 2014-01-31 NOTE — Plan of Care (Signed)
Problem: Phase II Progression Outcomes Goal: Activity at appropriate level-compared to baseline (UP IN CHAIR FOR HEMODIALYSIS)  Outcome: Completed/Met Date Met:  01/31/14 Goal: Tolerating diet advance to Outcome: Progressing

## 2014-01-31 NOTE — Plan of Care (Signed)
Problem: Phase II Progression Outcomes Goal: Tolerating diet advance to Outcome: Progressing     

## 2014-01-31 NOTE — Plan of Care (Signed)
Problem: Discharge Progression Outcomes Goal: Pain controlled with appropriate interventions Outcome: Completed/Met Date Met:  01/31/14     

## 2014-01-31 NOTE — Progress Notes (Signed)
Patient alert and oriented, Post op day 1.  Provided support and encouragement.  Encouraged pulmonary toilet, ambulation and small sips of liquids.  All questions answered.  Will continue to monitor. 

## 2014-01-31 NOTE — Plan of Care (Signed)
Problem: Discharge Progression Outcomes Goal: Tolerates PO analgesia Outcome: Completed/Met Date Met:  01/31/14

## 2014-01-31 NOTE — Plan of Care (Signed)
Problem: Phase II Progression Outcomes Goal: Surgical site without signs of infection Outcome: Completed/Met Date Met:  01/31/14

## 2014-01-31 NOTE — Plan of Care (Signed)
Problem: Phase II Progression Outcomes Goal: Pain controlled on oral analgesia Outcome: Completed/Met Date Met:  01/31/14     

## 2014-01-31 NOTE — Plan of Care (Signed)
Problem: Food- and Nutrition-Related Knowledge Deficit (NB-1.1) Goal: Nutrition education Formal process to instruct or train a patient/client in a skill or to impart knowledge to help patients/clients voluntarily manage or modify food choices and eating behavior to maintain or improve health. Outcome: Completed/Met Date Met:  01/31/14 Nutrition Education Note  Received consult for diet education per DROP protocol.   11/16 s/p LAPAROSCOPIC ROUX-EN-Y GASTRIC BYPASS WITH UPPER ENDOSCOPY,  LAPAROSCOPIC CHOLECYSTECTOMY WITH INTRAOPERATIVE CHOLANGIOGRAM   Discussed 2 week post op diet with pt. Emphasized that liquids must be non carbonated, non caffeinated, and sugar free. Fluid goals discussed. Pt to follow up with outpatient bariatric RD for further diet progression after 2 weeks. Multivitamins and minerals also reviewed. Teach back method used, pt expressed understanding, expect good compliance.   Diet: First 2 Weeks  You will see the nutritionist about two (2) weeks after your surgery. The nutritionist will increase the types of foods you can eat if you are handling liquids well:  If you have severe vomiting or nausea and cannot handle clear liquids lasting longer than 1 day, call your surgeon  Protein Shake  Drink at least 2 ounces of shake 5-6 times per day  Each serving of protein shakes (usually 8 - 12 ounces) should have a minimum of:  15 grams of protein  And no more than 5 grams of carbohydrate  Goal for protein each day:  Men = 80 grams per day  Women = 60 grams per day  Protein powder may be added to fluids such as non-fat milk or Lactaid milk or Soy milk (limit to 35 grams added protein powder per serving)   Hydration  Slowly increase the amount of water and other clear liquids as tolerated (See Acceptable Fluids)  Slowly increase the amount of protein shake as tolerated  Sip fluids slowly and throughout the day  May use sugar substitutes in small amounts (no more than 6 - 8  packets per day; i.e. Splenda)   Fluid Goal  The first goal is to drink at least 8 ounces of protein shake/drink per day (or as directed by the nutritionist); some examples of protein shakes are [employer], [employer], EAS Edge HP, and Unjury. See handout from pre-op Bariatric Education Class:  Slowly increase the amount of protein shake you drink as tolerated  You may find it easier to slowly sip shakes throughout the day  It is important to get your proteins in first  Your fluid goal is to drink 64 - 100 ounces of fluid daily  It may take a few weeks to build up to this  32 oz (or more) should be clear liquids  And  32 oz (or more) should be full liquids (see below for examples)  Liquids should not contain sugar, caffeine, or carbonation   Clear Liquids:  Water or Sugar-free flavored water (i.e. Fruit H2O, Propel)  Decaffeinated coffee or tea (sugar-free)  Crystal Lite, Wyler's Lite, Minute Maid Lite  Sugar-free Jell-O  Bouillon or broth  Sugar-free Popsicle: *Less than 20 calories each; Limit 1 per day   Full Liquids:  Protein Shakes/Drinks + 2 choices per day of other full liquids  Full liquids must be:  No More Than 12 grams of Carbs per serving  No More Than 3 grams of Fat per serving  Strained low-fat cream soup  Non-Fat milk  Fat-free Lactaid Milk  Sugar-free yogurt (Dannon Lite & Fit, Greek yogurt)    Clayton Bibles, MS, RD, LDN Pager: [phone] After  Hours Pager: [phone]

## 2014-01-31 NOTE — Progress Notes (Signed)
Patient ID: Maurice CapriceRobert Johnston, male   DOB: 10/01/1960, 53 y.o.   MRN: [id number] 1 Day Post-Op  Subjective: Has had nausea requiring meds. No vomiting or retching. Mild pain that is improving.  Objective: Vital signs in last 24 hours: Temp:  [97.6 F (36.4 C)-99.3 F (37.4 C)] 98.6 F (37 C) (11/17 0900) Pulse Rate:  [49-71] 60 (11/17 0900) Resp:  [12-19] 19 (11/17 0900) BP: (132-172)/(46-77) 142/54 mmHg (11/17 0900) SpO2:  [93 %-100 %] 98 % (11/17 0900) Last BM Date: 01/30/14  Intake/Output from previous day: 11/16 0701 - 11/17 0700 In: 3493.3 [I.V.:3493.3] Out: 755 [Urine:730; Blood:25] Intake/Output this shift:    General appearance: alert, cooperative and no distress Resp: clear to auscultation bilaterally GI: normal findings: soft, non-tender Incision/Wound: clean and dry  Lab Results:   Recent Labs  01/30/14 1837 01/31/14 0440  WBC  --  12.3*  HGB 14.6 14.3  HCT 43.9 40.7  PLT  --  157   BMET  Recent Labs  01/31/14 0440  NA 139  K 4.3  CL 100  CO2 23  GLUCOSE 136*  BUN 13  CREATININE 0.78  CALCIUM 9.0   CBG (last 3)   Recent Labs  01/30/14 2017 01/30/14 2121 01/31/14 0729  GLUCAP 163* 163* 155*      Studies/Results: Dg Cholangiogram Operative  01/30/2014   CLINICAL DATA:  Right upper quadrant pain  EXAM: INTRAOPERATIVE CHOLANGIOGRAM  TECHNIQUE: Cholangiographic images from the C-arm fluoroscopic device were submitted for interpretation post-operatively. Please see the procedural report for the amount of contrast and the fluoroscopy time utilized.  COMPARISON:  None.  FINDINGS: Contrast fills the biliary tree and duodenum without filling defects in the common bile duct.  IMPRESSION: Patent biliary tree without evidence of common bile duct stones.   Electronically Signed   By: Maryclare BeanArt  Hoss M.D.   On: 01/30/2014 10:58   Dg Kayleen MemosUgi W/water Sol Cm  01/31/2014   CLINICAL DATA:  Patient status post Roux-en- Y gastric bypass surgery  EXAM: UPPER GI SERIES  WITH KUB  TECHNIQUE: After obtaining a scout radiograph a routine upper GI series was performed using water-soluble contrast.  FLUOROSCOPY TIME:  1 min 48 seconds  COMPARISON:  CT 09/03/2012  FINDINGS: Initial KUB demonstrates no bowel obstruction. Surgical clips noted in the gastric cardiac region as well as in the gallbladder fossa. Additional surgical clips noted in the left mid abdomen.  The patient slowly drank the oral contrast. Contrast flowed readily into the small gastric pouch. There is some to and fro motion of contrast within the esophagus. Contrast slowly flowed through the gastro jejunostomy after approximately 3 to 5 min. No evidence of leak or obstruction.  IMPRESSION: 1. No evidence of leak or obstruction following Roux-en-Y gastric bypass surgery. 2. Slow transit of contrast through the gastrojejunostomy with some to and fro motion of retained contrast in the esophagus.   Electronically Signed   By: Genevive BiStewart  Edmunds M.D.   On: 01/31/2014 09:18    Anti-infectives: Anti-infectives    Start     Dose/Rate Route Frequency Ordered Stop   01/30/14 [phone]  cefOXitin (MEFOXIN) 2 g in dextrose 5 % 50 mL IVPB     2 g100 mL/hr over 30 Minutes Intravenous On call to O.R. 01/30/14 [id number] 01/30/14 0956      Assessment/Plan: s/p Procedure(s): LAPAROSCOPIC ROUX-EN-Y GASTRIC BYPASS WITH UPPER ENDOSCOPY LAPAROSCOPIC CHOLECYSTECTOMY WITH INTRAOPERATIVE CHOLANGIOGRAM Doing well postoperatively without apparent complication. Begin Postoperative day #1 diet.   LOS: 1 day  Undra Trembath T 01/31/2014

## 2014-02-01 LAB — CBC WITH DIFFERENTIAL/PLATELET
Basophils Absolute: 0 10*3/uL (ref 0.0–0.1)
Basophils Relative: 0 % (ref 0–1)
EOS ABS: 0 10*3/uL (ref 0.0–0.7)
Eosinophils Relative: 0 % (ref 0–5)
HEMATOCRIT: 39.6 % (ref 39.0–52.0)
HEMOGLOBIN: 13.3 g/dL (ref 13.0–17.0)
Lymphocytes Relative: 11 % — ABNORMAL LOW (ref 12–46)
Lymphs Abs: 1.2 10*3/uL (ref 0.7–4.0)
MCH: 28.5 pg (ref 26.0–34.0)
MCHC: 33.6 g/dL (ref 30.0–36.0)
MCV: 85 fL (ref 78.0–100.0)
MONO ABS: 0.7 10*3/uL (ref 0.1–1.0)
MONOS PCT: 7 % (ref 3–12)
Neutro Abs: 9.1 10*3/uL — ABNORMAL HIGH (ref 1.7–7.7)
Neutrophils Relative %: 82 % — ABNORMAL HIGH (ref 43–77)
Platelets: 146 10*3/uL — ABNORMAL LOW (ref 150–400)
RBC: 4.66 MIL/uL (ref 4.22–5.81)
RDW: 13.4 % (ref 11.5–15.5)
WBC: 11 10*3/uL — ABNORMAL HIGH (ref 4.0–10.5)

## 2014-02-01 LAB — GLUCOSE, CAPILLARY: Glucose-Capillary: 139 mg/dL — ABNORMAL HIGH (ref 70–99)

## 2014-02-01 MED ORDER — INSULIN GLARGINE 100 UNIT/ML ~~LOC~~ SOLN: 15.0000 [IU] | Freq: Every day | SUBCUTANEOUS | Status: DC

## 2014-02-01 NOTE — Plan of Care (Signed)
Problem: Phase I Progression Outcomes Goal: Pulmonary function stable Outcome: Not Applicable Date Met:  03/49/17

## 2014-02-01 NOTE — Plan of Care (Signed)
Problem: Discharge Progression Outcomes Goal: Complications resolved/controlled Outcome: Completed/Met Date Met:  02/01/14     

## 2014-02-01 NOTE — Plan of Care (Signed)
Problem: Phase II Progression Outcomes Goal: Pulmonary function stable Outcome: Not Applicable Date Met:  80/04/47

## 2014-02-01 NOTE — Discharge Summary (Signed)
Patient discharged teachins done. Provide informations on follow up chek- up and home medications.  Aswered all questions.

## 2014-02-01 NOTE — Plan of Care (Signed)
Problem: Phase II Progression Outcomes Goal: Hemodynamically stable Outcome: Completed/Met Date Met:  02/01/14

## 2014-02-01 NOTE — Plan of Care (Signed)
Problem: Discharge Progression Outcomes Goal: Discharge plan in place and appropriate Outcome: Progressing     

## 2014-02-01 NOTE — Plan of Care (Signed)
Problem: Phase II Progression Outcomes Goal: Tolerating diet advance to Outcome: Completed/Met Date Met:  02/01/14

## 2014-02-01 NOTE — Plan of Care (Signed)
Problem: Discharge Progression Outcomes Goal: Tolerates Bariatric Shake Diet Outcome: Completed/Met Date Met:  02/01/14

## 2014-02-01 NOTE — Plan of Care (Signed)
Problem: Discharge Progression Outcomes Goal: Hemodynamically stable Outcome: Completed/Met Date Met:  02/01/14

## 2014-02-01 NOTE — Discharge Instructions (Signed)
Per bariatric nurse ° ° ° °GASTRIC BYPASS/SLEEVE ° Home Care Instructions ° ° These instructions are to help you care for yourself when you go home. ° °Call: If you have any problems. °• Call [phone] and ask for the surgeon on call °• If you need immediate assistance come to the ER at [facility]. Tell the ER staff you are a new post-op gastric bypass or gastric sleeve patient  °Signs and symptoms to report: • Severe  vomiting or nausea °o If you cannot handle clear liquids for longer than 1 day, call your surgeon °• Abdominal pain which does not get better after taking your pain medication °• Fever greater than 100.4°  F and chills °• Heart rate over 100 beats a minute °• Trouble breathing °• Chest pain °• Redness,  swelling, drainage, or foul odor at incision (surgical) sites °• If your incisions open or pull apart °• Swelling or pain in calf (lower leg) °• Diarrhea (Loose bowel movements that happen often), frequent watery, uncontrolled bowel movements °• Constipation, (no bowel movements for 3 days) if this happens: °o Take Milk of Magnesia, 2 tablespoons by mouth, 3 times a day for 2 days if needed °o Stop taking Milk of Magnesia once you have had a bowel movement °o Call your doctor if constipation continues °Or °o Take Miralax  (instead of Milk of Magnesia) following the label instructions °o Stop taking Miralax once you have had a bowel movement °o Call your doctor if constipation continues °• Anything you think is “abnormal for you” °  °Normal side effects after surgery: • Unable to sleep at night or unable to concentrate °• Irritability °• Being tearful (crying) or depressed ° °These are common complaints, possibly related to your anesthesia, stress of surgery, and change in lifestyle, that usually go away a few weeks after surgery. If these feelings continue, call your medical doctor.  °Wound Care: You may have surgical glue, steri-strips, or staples over your incisions after surgery °• Surgical  glue: Looks like clear film over your incisions and will wear off a little at a time °• Steri-strips: Adhesive strips of tape over your incisions. You may notice a yellowish color on skin under the steri-strips. This is used to make the steri-strips stick better. Do not pull the steri-strips off - let them fall off °• Staples: Staples may be removed before you leave the hospital °o If you go home with staples, call [facility] for an appointment with your surgeon’s nurse to have staples removed 10 days after surgery, [phone] °• Showering: You may shower two (2) days after your surgery unless your surgeon tells you differently °o Wash gently around incisions with warm soapy water, rinse well, and gently pat dry °o If you have a drain (tube from your incision), you may need someone to hold this while you shower °o No tub baths until staples are removed and incisions are healed °  °Medications: • Medications should be liquid or crushed if larger than the size of a dime °• Extended release pills (medication that releases a little bit at a time through the  day) should not be crushed °• Depending on the size and number of medications you take, you may need to space (take a few throughout the day)/change the time you take your medications so that you do not over-fill your pouch (smaller stomach) °• Make sure you follow-up with you primary care physician to make medication changes needed during rapid weight loss and   life -style changes °• If you have diabetes, follow up with your doctor that orders your diabetes medication(s) within one week after surgery and check your blood sugar regularly ° °• Do not drive while taking narcotics (pain medications) ° °• Do not take acetaminophen (Tylenol) and Roxicet or Lortab Elixir at the same time since these pain medications contain acetaminophen °  °Diet:  °First 2 Weeks You will see the nutritionist about two (2) weeks after your surgery. The nutritionist will  increase the types of foods you can eat if you are handling liquids well: °• If you have severe vomiting or nausea and cannot handle clear liquids lasting longer than 1 day call your surgeon °Protein Shake °• Drink at least 2 ounces of shake 5-6 times per day °• Each serving of protein shakes (usually 8-12 ounces) should have a minimum of: °o 15 grams of protein °o And no more than 5 grams of carbohydrate °• Goal for protein each day: °o Men = 80 grams per day °o Women = 60 grams per day °  ° • Protein powder may be added to fluids such as non-fat milk or Lactaid milk or Soy milk (limit to 35 grams added protein powder per serving) ° °Hydration °• Slowly increase the amount of water and other clear liquids as tolerated (See Acceptable Fluids) °• Slowly increase the amount of protein shake as tolerated °• Sip fluids slowly and throughout the day °• May use sugar substitutes in small amounts (no more than 6-8 packets per day; i.e. Splenda) ° °Fluid Goal °• The first goal is to drink at least 8 ounces of protein shake/drink per day (or as directed by the nutritionist); some examples of protein shakes are Syntrax Nectar, Adkins Advantage, EAS Edge HP, and Unjury. - See handout from pre-op Bariatric Education Class: °o Slowly increase the amount of protein shake you drink as tolerated °o You may find it easier to slowly sip shakes throughout the day °o It is important to get your proteins in first °• Your fluid goal is to drink 64-100 ounces of fluid daily °o It may take a few weeks to build up to this  °• 32 oz. (or more) should be clear liquids °And °• 32 oz. (or more) should be full liquids (see below for examples) °• Liquids should not contain sugar, caffeine, or carbonation ° °Clear Liquids: °• Water of Sugar-free flavored water (i.e. Fruit H²O, Propel) °• Decaffeinated coffee or tea (sugar-free) °• Crystal lite, Wyler’s Lite, Minute Maid Lite °• Sugar-free Jell-O °• Bouillon or broth °• Sugar-free Popsicle:    -  Less than 20 calories each; Limit 1 per day ° °Full Liquids: °                  Protein Shakes/Drinks + 2 choices per day of other full liquids °• Full liquids must be: °o No More Than 12 grams of Carbs per serving °o No More Than 3 grams of Fat per serving °• Strained low-fat cream soup °• Non-Fat milk °• Fat-free Lactaid Milk °• Sugar-free yogurt (Dannon Lite & Fit, Greek yogurt) ° °  °Vitamins and Minerals • Start 1 day after surgery unless otherwise directed by your surgeon °• 2 Chewable Multivitamin / Multimineral Supplement with iron (i.e. Centrum for Adults) °• Vitamin B-12, 350-500 micrograms sub-lingual (place tablet under the tongue) each day °• Chewable Calcium Citrate with Vitamin D-3 °(Example: 3 Chewable Calcium  Plus 600 with Vitamin D-3) °o Take 500 mg three (3)   times a day for a total of 1500 mg each day °o Do not take all 3 doses of calcium at one time as it may cause constipation, and you can only absorb 500 mg at a time °o Do not mix multivitamins containing iron with calcium supplements;  take 2 hours apart °o Do not substitute Tums (calcium carbonate) for your calcium °• Menstruating women and those at risk for anemia ( a blood disease that causes weakness) may need extra iron °o Talk to your doctor to see if you need more iron °• If you need extra iron: Total daily Iron recommendation (including Vitamins) is 50 to 100 mg Iron/day °• Do not stop taking or change any vitamins or minerals until you talk to your nutritionist or surgeon °• Your nutritionist and/or surgeon must approve all vitamin and mineral supplements °  °Activity and Exercise: It is important to continue walking at home. Limit your physical activity as instructed by your doctor. During this time, use these guidelines: °• Do not lift anything greater than ten  (10) pounds for at least two (2) weeks °• Do not go back to work or drive until your surgeon says you can °• You may have sex when you feel comfortable °o It is VERY  important for male patients to use a reliable birth control method; fertility often increase after surgery °o Do not get pregnant for at least 18 months °• Start exercising as soon as your doctor tells you that you can °o Make sure your doctor approves any physical activity °• Start with a simple walking program °• Walk 5-15 minutes each day, 7 days per week °• Slowly increase until you are walking 30-45 minutes per day °• Consider joining our BELT program. [phone] or email [email or web address] °  °Special Instructions Things to remember: °• Free counseling is available for you and your family through collaboration between [facility] and [employer]. Please call [phone] and leave a message °• Use your CPAP when sleeping if this applies to you °• Consider buying a medical alert bracelet that says you had lap-band surgery °  °  You will likely have your first fill (fluid added to your band) 6 - 8 weeks after surgery °• [facility] has a free Bariatric Surgery Support Group that meets monthly, the 3rd Thursday, 6pm. [facility] Classrooms. You can see classes online at [email or web address] °• It is very important to keep all follow up appointments with your surgeon, nutritionist, primary care physician, and behavioral health practitioner °o After the first year, please follow up with your bariatric surgeon and nutritionist at least once a year in order to maintain best weight loss results °      °             [facility]:  [phone] ° °             [facility]: [phone] ° °             Bariatric Nurse Coordinator: [phone]  °Gastric Bypass/Sleeve Home Care Instructions  Rev. 04/2012    ° °                                                    Reviewed and Endorsed °                                                     by [place] Patient Education Committee, Jan, 2014 ° ° ° ° ° ° ° ° ° °

## 2014-02-01 NOTE — Plan of Care (Signed)
Problem: Phase I Progression Outcomes Goal: Hemodynamically stable Outcome: Completed/Met Date Met:  02/01/14

## 2014-02-01 NOTE — Progress Notes (Signed)
Patient alert and oriented, pain is controlled. Patient is tolerating fluids,  advanced to protein shake today, patient tolerated well. Reviewed Gastric Bypass discharge instructions with patient and patient is able to articulate understanding. Provided information on BELT program, Support Group and [facility] outpatient pharmacy. All questions answered, will continue to monitor.    

## 2014-02-01 NOTE — Plan of Care (Signed)
Problem: Discharge Progression Outcomes Goal: Hemodynamically stable Outcome: Adequate for Discharge     

## 2014-02-01 NOTE — Plan of Care (Signed)
Problem: Phase I Progression Outcomes Goal: Other Phase I Outcomes/Goals Outcome: Completed/Met Date Met:  02/01/14

## 2014-02-01 NOTE — Discharge Summary (Signed)
Patient ID: Maurice Johnston [id number] 53 y.o. 02/11/1961  01/30/2014  Discharge date and time: 02/01/2014   Admitting Physician: Glenna FellowsHOXWORTH,Masaki Rothbauer T  Discharge Physician: Glenna FellowsHOXWORTH,Fiorela Pelzer T  Admission Diagnoses: Morbid Obesity  Discharge Diagnoses: Same  Operations: Procedure(s): LAPAROSCOPIC ROUX-EN-Y GASTRIC BYPASS WITH UPPER ENDOSCOPY LAPAROSCOPIC CHOLECYSTECTOMY WITH INTRAOPERATIVE CHOLANGIOGRAM  Admission Condition: good  Discharged Condition: good  Indication for Admission: Patient is a [age] year old male with progressive morbid obesity unresponsive to medical management who presents at a BMI of 40 and multiple comorbidities including insulin-dependent diabetes mellitus, reflux, hypertension, elevated cholesterol and coronary artery disease. After extensive preoperative workup and discussion detailed elsewhere we have elected to proceed with laparoscopic Roux-en-Y gastric bypass for treatment of his morbid obesity. Preoperative workup revealed cholelithiasis and we planned cholecystectomy with cholangiogram as well.  Hospital Course: On the morning of admission the patient underwent an uneventful laparoscopic Roux-en-Y gastric bypass and cholecystectomy with cholangiogram. His postoperative course was uncomplicated. On the first postoperative day he had some moderate nausea. This improved during the day. Gastrografin swallow showed no leak or obstruction and he was started on water. On the second postoperative day his white count is decreased to 11,000. Vital signs within normal limits. He feels significantly better with no pain or nausea. Tolerating protein shakes. Abdomen is soft and nontender wounds clean.  Disposition: Home  Patient Instructions:    Medication List    STOP taking these medications        ibuprofen 200 MG tablet  Commonly known as:  ADVIL,MOTRIN     metoprolol succinate 50 MG 24 hr tablet  Commonly known as:  TOPROL-XL      TAKE these  medications        albuterol 108 (90 BASE) MCG/ACT inhaler  Commonly known as:  PROVENTIL HFA;VENTOLIN HFA  Inhale 2 puffs into the lungs every 4 (four) hours as needed for wheezing or shortness of breath.     aspirin EC 81 MG tablet  Take 81 mg by mouth at bedtime.     atorvastatin 40 MG tablet  Commonly known as:  LIPITOR  Take 40 mg by mouth at bedtime.     clopidogrel 75 MG tablet  Commonly known as:  PLAVIX  Take 1 tablet (75 mg total) by mouth daily.     esomeprazole 40 MG packet  Commonly known as:  NEXIUM  Take 40 mg by mouth at bedtime and may repeat dose one time if needed.     fluticasone 50 MCG/ACT nasal spray  Commonly known as:  FLONASE  Place 2 sprays into both nostrils daily as needed for allergies or rhinitis.     insulin aspart 100 UNIT/ML FlexPen  Commonly known as:  NOVOLOG  Inject 40-70 Units into the skin 3 (three) times daily with meals. 40 units and then 1 unit for every 10 over blood sugar of 100     insulin glargine 100 UNIT/ML injection  Commonly known as:  LANTUS  Inject 0.15 mLs (15 Units total) into the skin at bedtime.     metFORMIN 500 MG tablet  Commonly known as:  GLUCOPHAGE  Take 1 tablet by mouth 2 (two) times daily.     metoprolol tartrate 25 MG tablet  Commonly known as:  LOPRESSOR  Take 1 tablet (25 mg total) by mouth 2 (two) times daily.     multivitamin with minerals Tabs tablet  Take 1 tablet by mouth every morning.     NASCOBAL 500 MCG/0.1ML Soln  Generic drug:  Cyanocobalamin  Place into the nose. Once a week to start after surgery     nitroGLYCERIN 0.4 MG SL tablet  Commonly known as:  NITROSTAT  Place 1 tablet (0.4 mg total) under the tongue every 5 (five) minutes x 3 doses as needed for chest pain.     omega-3 acid ethyl esters 1 G capsule  Commonly known as:  LOVAZA  Take 4 g by mouth at bedtime and may repeat dose one time if needed.     telmisartan 40 MG tablet  Commonly known as:  MICARDIS  Take 40 mg by  mouth every evening.     temazepam 30 MG capsule  Commonly known as:  RESTORIL  Take 30 mg by mouth at bedtime as needed for sleep.        Activity: activity as tolerated Diet: bariatric protein shakes Wound Care: none needed  Follow-up:  With Dr. Johna SheriffHoxworth in 3 weeks.  Signed: Mariella SaaBenjamin T Layne Dilauro MD, FACS  02/01/2014, 9:02 AM

## 2014-02-01 NOTE — Plan of Care (Signed)
Problem: Phase II Progression Outcomes Goal: Other Phase II Outcomes/Goals Outcome: Completed/Met Date Met:  02/01/14  Problem: Discharge Progression Outcomes Goal: Discharge plan in place and appropriate Outcome: Completed/Met Date Met:  02/01/14 Goal: Other Discharge Outcomes/Goals Outcome: Completed/Met Date Met:  02/01/14

## 2014-02-01 NOTE — Plan of Care (Signed)
Problem: Phase II Progression Outcomes Goal: Doppler acceptable Outcome: Not Applicable Date Met:  70/96/43

## 2014-02-01 NOTE — Plan of Care (Signed)
Problem: Discharge Progression Outcomes Goal: Activity appropriate for discharge plan Outcome: Completed/Met Date Met:  02/01/14

## 2014-02-01 NOTE — Progress Notes (Signed)
Patient ID: Maurice CapriceRobert Johnston, male   DOB: 09/23/1960, 53 y.o.   MRN: [id number] 2 Days Post-Op  Subjective: Feels "much better" today. Very mild abdominal discomfort. No nausea. Starting protein shakes without difficulty. Has been ambulatory and had a bowel movement.  Objective: Vital signs in last 24 hours: Temp:  [97.5 F (36.4 C)-98.6 F (37 C)] 97.5 F (36.4 C) (11/18 0600) Pulse Rate:  [60-71] 64 (11/18 0600) Resp:  [18-19] 18 (11/18 0600) BP: (131-186)/(50-67) 147/64 mmHg (11/18 0600) SpO2:  [95 %-98 %] 97 % (11/18 0600) Last BM Date: 02/01/14  Intake/Output from previous day: 11/17 0701 - 11/18 0700 In: 2460 [P.O.:60; I.V.:2400] Out: 1675 [Urine:1675] Intake/Output this shift: Total I/O In: 60 [P.O.:60] Out: -   General appearance: alert, cooperative and no distress GI: Abdomen soft and nontender Incision/Wound: No evidence of infection  Lab Results:   Recent Labs  01/31/14 0440 01/31/14 1546 02/01/14 0415  WBC 12.3*  --  11.0*  HGB 14.3 13.4 13.3  HCT 40.7 40.2 39.6  PLT 157  --  146*   BMET  Recent Labs  01/31/14 0440  NA 139  K 4.3  CL 100  CO2 23  GLUCOSE 136*  BUN 13  CREATININE 0.78  CALCIUM 9.0     Studies/Results: Dg Cholangiogram Operative  01/30/2014   CLINICAL DATA:  Right upper quadrant pain  EXAM: INTRAOPERATIVE CHOLANGIOGRAM  TECHNIQUE: Cholangiographic images from the C-arm fluoroscopic device were submitted for interpretation post-operatively. Please see the procedural report for the amount of contrast and the fluoroscopy time utilized.  COMPARISON:  None.  FINDINGS: Contrast fills the biliary tree and duodenum without filling defects in the common bile duct.  IMPRESSION: Patent biliary tree without evidence of common bile duct stones.   Electronically Signed   By: Maryclare BeanArt  Hoss M.D.   On: 01/30/2014 10:58   Dg Kayleen MemosUgi W/water Sol Cm  01/31/2014   CLINICAL DATA:  Patient status post Roux-en- Y gastric bypass surgery  EXAM: UPPER GI SERIES  WITH KUB  TECHNIQUE: After obtaining a scout radiograph a routine upper GI series was performed using water-soluble contrast.  FLUOROSCOPY TIME:  1 min 48 seconds  COMPARISON:  CT 09/03/2012  FINDINGS: Initial KUB demonstrates no bowel obstruction. Surgical clips noted in the gastric cardiac region as well as in the gallbladder fossa. Additional surgical clips noted in the left mid abdomen.  The patient slowly drank the oral contrast. Contrast flowed readily into the small gastric pouch. There is some to and fro motion of contrast within the esophagus. Contrast slowly flowed through the gastro jejunostomy after approximately 3 to 5 min. No evidence of leak or obstruction.  IMPRESSION: 1. No evidence of leak or obstruction following Roux-en-Y gastric bypass surgery. 2. Slow transit of contrast through the gastrojejunostomy with some to and fro motion of retained contrast in the esophagus.   Electronically Signed   By: Genevive BiStewart  Edmunds M.D.   On: 01/31/2014 09:18    Anti-infectives: Anti-infectives    Start     Dose/Rate Route Frequency Ordered Stop   01/30/14 [phone]  cefOXitin (MEFOXIN) 2 g in dextrose 5 % 50 mL IVPB     2 g100 mL/hr over 30 Minutes Intravenous On call to O.R. 01/30/14 [id number] 01/30/14 0956      Assessment/Plan: s/p Procedure(s): LAPAROSCOPIC ROUX-EN-Y GASTRIC BYPASS WITH UPPER ENDOSCOPY LAPAROSCOPIC CHOLECYSTECTOMY WITH INTRAOPERATIVE CHOLANGIOGRAM Doing well postoperatively. Okay for discharge.   LOS: 2 days    [name] 02/01/2014

## 2014-02-01 NOTE — Plan of Care (Signed)
Problem: Phase II Progression Outcomes Goal: Discharge plan remains appropriate-arrangements made Outcome: Completed/Met Date Met:  02/01/14

## 2014-02-01 NOTE — Plan of Care (Signed)
Problem: Discharge Progression Outcomes Goal: Other Discharge Outcomes/Goals Outcome: Progressing     

## 2014-02-01 NOTE — Plan of Care (Signed)
Problem: Discharge Progression Outcomes Goal: Pulmonary function stable Outcome: Not Applicable Date Met:  73/41/93

## 2014-02-01 NOTE — Plan of Care (Signed)
Problem: Phase I Progression Outcomes Goal: Bariatric bed/trapeze per MD Outcome: Not Applicable Date Met:  54/49/20

## 2014-02-01 NOTE — Plan of Care (Signed)
Problem: Phase I Progression Outcomes Goal: Initial discharge plan identified Outcome: Completed/Met Date Met:  02/01/14     

## 2014-02-01 NOTE — Plan of Care (Signed)
Problem: Discharge Progression Outcomes Goal: Barriers To Progression Addressed/Resolved Outcome: Completed/Met Date Met:  02/01/14     

## 2014-02-01 NOTE — Plan of Care (Signed)
Problem: Phase II Progression Outcomes Goal: CPAP/BI-PAP utilized with sleeping per order Outcome: Not Applicable Date Met:  93/71/69

## 2014-02-02 ENCOUNTER — Telehealth (HOSPITAL_COMMUNITY): Payer: Self-pay

## 2014-02-02 NOTE — Telephone Encounter (Signed)
Made discharge phone call to patient per DROP protocol. Asking the following questions.    1. Do you have someone to care for you now that you are home?  yes 2. Are you having pain now that is not relieved by your pain medication?  no 3. Are you able to drink the recommended daily amount of fluids (48 ounces minimum/day) and protein (60-80 grams/day) as prescribed by the dietitian or nutritional counselor?  Yes, working on it 4. Are you taking the vitamins and minerals as prescribed?  Not yet, but am going to start tomorrow 5. Do you have the "on call" number to contact your surgeon if you have a problem or question?  yes 6. Are your incisions free of redness, swelling or drainage? (If steri strips, address that these can fall off, shower as tolerated) yes 7. Have your bowels moved since your surgery?  If not, are you passing gas?  yes 8. Are you up and walking 3-4 times per day?  Yes, went to the gym     1. Do you have an appointment made to see your surgeon in the next month?  yes 2. Were you provided your discharge medications before your surgery or before you were discharged from the hospital and are you taking them without problem?  yes 3. Were you provided phone numbers to the clinic/surgeon's office?  yes 4. Did you watch the patient education video module in the (clinic, surgeon's office, etc.) before your surgery? yes 5. Do you have a discharge checklist that was provided to you in the hospital to reference with instructions on how to take care of yourself after surgery?  yes 6. Did you see a dietitian or nutritional counselor while you were in the hospital?  yes 7. Do you have an appointment to see a dietitian or nutritional counselor in the next month? yes

## 2014-02-14 ENCOUNTER — Encounter: Payer: BC Managed Care – PPO | Attending: General Surgery

## 2014-02-14 DIAGNOSIS — Z713 Dietary counseling and surveillance: Secondary | ICD-10-CM | POA: Insufficient documentation

## 2014-02-14 DIAGNOSIS — Z6833 Body mass index (BMI) 33.0-33.9, adult: Secondary | ICD-10-CM | POA: Diagnosis not present

## 2014-02-15 NOTE — Progress Notes (Signed)
Bariatric Class:  Appt start time: 1530 end time:  1630.  2 Week Post-Operative Nutrition Class  Patient was seen on 02/14/14 for Post[facility]ative [facility]   Surgery date: 01/30/2014 Surgery type: RYGB Start weight at [facility]: 266.5 lbs on 11/16/2013 Weight today: 235.0 lbs  Weight change: 28 lbs  TANITA  BODY COMP RESULTS  01/09/14 02/14/14   BMI (kg/m^2) 37.7 33.7   Fat Mass (lbs) 92.5 70.5   Fat Free Mass (lbs) 170.5 164.5   Total Body Water (lbs) 125 120.5    The following the learning objectives were met by the patient during this course:  Identifies Phase 3A (Soft, High Proteins) Dietary Goals and will begin from 2 weeks post-operatively to 2 months post-operatively  Identifies appropriate sources of fluids and proteins   States protein recommendations and appropriate sources post-operatively  Identifies the need for appropriate texture modifications, mastication, and bite sizes when consuming solids  Identifies appropriate multivitamin and calcium sources post-operatively  Describes the need for physical activity post-operatively and will follow MD recommendations  States when to call healthcare provider regarding medication questions or post-operative complications  Handouts given during class include:  Phase 3A: Soft, High Protein Diet Handout  Follow-Up Plan: Patient will follow-up at [facility] in 6 weeks for 2 month post-op nutrition visit for diet advancement per MD.

## 2014-02-15 NOTE — Patient Instructions (Signed)
Patient to follow Phase 3A-Soft, High Protein Diet and follow-up at [facility] in 6 weeks for 2 months post-op nutrition visit for diet advancement. 

## 2014-02-23 ENCOUNTER — Encounter (HOSPITAL_COMMUNITY): Payer: Self-pay | Admitting: Interventional Cardiology

## 2014-03-27 ENCOUNTER — Encounter: Payer: BLUE CROSS/BLUE SHIELD | Attending: General Surgery | Admitting: Dietician

## 2014-03-27 DIAGNOSIS — Z713 Dietary counseling and surveillance: Secondary | ICD-10-CM | POA: Insufficient documentation

## 2014-03-27 DIAGNOSIS — Z6833 Body mass index (BMI) 33.0-33.9, adult: Secondary | ICD-10-CM | POA: Diagnosis not present

## 2014-03-27 NOTE — Patient Instructions (Signed)
Goals:  Follow Phase 3B: High Protein + Non-Starchy Vegetables  Eat 3-6 small meals/snacks, every 3-5 hrs  Increase lean protein foods to meet 80g goal  Increase fluid intake to 64oz +  Avoid drinking 15 minutes before, during and 30 minutes after eating  Aim for >30 min of physical activity daily 

## 2014-03-27 NOTE — Progress Notes (Signed)
  Follow-up visit:  8 Weeks Post-Operative RYGB Surgery  Medical Nutrition Therapy:  Appt start time: 0730 end time:  0745.  Primary concerns today: Post-operative Bariatric Surgery Nutrition Management. Returns with a 25 lbs weight loss. Feeling good overall and losing about 2-3 lbs per week. Tolerating most of the foods that he is eating. Having some issues with eating too fast sometimes. No longer doing protein shakes.  Able to tolerate some carbs.  Surgery date: 01/30/2014 Surgery type: RYGB [facility]: 266.5 lbs on 11/16/2013 Weight today: 210 lbs  Weight change: 25 lbs Total weight loss: 56.5 lbs Weight loss goal: 160 lbs  TANITA BODY COMP RESULTS  01/09/14 02/14/14 03/27/14  BMI (kg/m^2) 37.7 33.7 30.1  Fat Mass (lbs) 92.5 70.5 61.5  Fat Free Mass (lbs) 170.5 164.5 148.5  Total Body Water (lbs) 125 120.5 108.5    Preferred Learning Style:   No preference indicated   Learning Readiness:   Ready  24-hr recall: B (AM): eggs with 1 slice cheese and  1 oz meat or [facility] Protein (10-20 g) Snk (AM): cheese stick or beef jerky or nuts (7 g) L (PM): chili, soups or 3-4 oz of protein (10-28 g) Snk (PM):  cheese stick or beef jerky or nuts (7 g) D (PM): 3-4 oz of protein and vegetables with potatoes sometimes (21-28 g) Snk (PM):SF popsiciles  Fluid intake: water mostly (60-80 oz) Estimated total protein intake: 55-90 g  Medications: see list Supplementation: having trouble getting in all of the iron, taking the rest  CBG monitoring: 2 x day (morning and night) Average CBG per patient: 115-120 mg/dl Sometimes higher depending on what he ate or after exercising. Highest reading is 160 mg/dl/ Last patient reported A1c: 6.6% in October per patient  Using straws: Not usually  Drinking while eating: sometimes Hair loss: No Carbonated beverages: a little beer, not really N/V/D/C: just when he eats too fast (vomiting), constipation has  resolved after adding more foods  Dumping syndrome: No  Recent physical activity:  Walks 30-60 minutes everyday on the treadmill, lifts weights 5 x week   Progress Towards Goal(s):  In progress.  Handouts given during visit include:  Phase 3B High Protein and Non Starchy Vegetables   Nutritional Diagnosis:  [place]-3.3 Overweight/obesity related to past poor dietary habits and physical inactivity as evidenced by patient w/ recent RYGB surgery following dietary guidelines for continued weight loss.    Intervention:  Nutrition counseling provided. Goals:  Follow Phase 3B: High Protein + Non-Starchy Vegetables  Eat 3-6 small meals/snacks, every 3-5 hrs  Increase lean protein foods to meet 80g goal  Increase fluid intake to 64oz +  Avoid drinking 15 minutes before, during and 30 minutes after eating  Aim for >30 min of physical activity daily  Teaching Method Utilized:  Visual Auditory Hands on  Barriers to learning/adherence to lifestyle change: none  Demonstrated degree of understanding via:  Teach Back   Monitoring/Evaluation:  Dietary intake, exercise,and body weight. Follow up in 2 months for 5 month post-op visit.

## 2014-05-31 ENCOUNTER — Encounter: Payer: BLUE CROSS/BLUE SHIELD | Attending: General Surgery | Admitting: Dietician

## 2014-05-31 DIAGNOSIS — Z6833 Body mass index (BMI) 33.0-33.9, adult: Secondary | ICD-10-CM | POA: Diagnosis not present

## 2014-05-31 DIAGNOSIS — Z713 Dietary counseling and surveillance: Secondary | ICD-10-CM | POA: Insufficient documentation

## 2014-05-31 NOTE — Patient Instructions (Addendum)
Goals:  Follow Phase 3B: High Protein + Non-Starchy Vegetables  Eat 3-6 small meals/snacks, every 3-5 hrs  Increase lean protein foods to meet 80g goal  Increase fluid intake to 64oz +  Avoid drinking 15 minutes before, during and 30 minutes after eating  Continue physical activity daily  Eat protein foods first, then non starchy vegetable, and then carbs if you are hungry

## 2014-05-31 NOTE — Progress Notes (Signed)
  Follow-up visit:  4 Months Post-Operative RYGB Surgery  Medical Nutrition Therapy:  Appt start time: 0730 end time: 745 .  Primary concerns today: Post-operative Bariatric Surgery Nutrition Management. Returns with a 17.5 lbs weight loss. Feeling great. No longer taking insulin or metformin. Tolerating all foods as long as he eats slowly. Can tell when blood sugar is elevated.  Surgery date: 01/30/2014 Surgery type: [facility]YGB [facility]5 lbs on 11/16/2013 Weight today: 192.5 lbs  Weight change: 17.5 lbs Total weight loss: 74 lbs Weight loss goal: 175 lbs  TANITA BODY COMP RESULTS  01/09/14 02/14/14 03/27/14 05/31/14  BMI (kg/m^2) 37.7 33.7 30.1 27.6  Fat Mass (lbs) 92.5 70.5 61.5 46.5  Fat Free Mass (lbs) 170.5 164.5 148.5 146.0  Total Body Water (lbs) 125 120.5 108.5 107.0    Preferred Learning Style:   No preference indicated   Learning Readiness:   Ready  24-hr recall: B (AM): eggs with 1 slice cheese and  1 oz meat or protein bar and banana (10-20 g) Snk (AM): cheese stick or beef jerky or nuts (7 g) L (PM): chili, soups or 3-4 oz of protein (10-28 g) Snk (PM):  cheese stick or beef jerky or nuts (7 g) D (PM): 3-4 oz of protein and vegetables with potatoes sometimes (21-28 g) Snk (PM):SF popsiciles  Fluid intake: water mostly (70-80 oz) Estimated total protein intake: 80-90 g  Medications: see list Supplementation: having trouble getting all in   CBG monitoring: sometimes  Average CBG per patient: 100-110 mg/dl, [id number] mg/dl after eating Last patient reported A1c: 5.5% in December per patient  Using straws: Not usually  Drinking while eating: sometimes Hair loss: No Carbonated beverages: a little beer N/V/D/C:  Constipation sometimes Dumping syndrome: Not recently  Recent physical activity:  Walks 60 minutes 5 x week on the treadmill, hasn't lifted weights lately since he has bursitis in his shoulder  Progress Towards Goal(s):   In progress.    Nutritional Diagnosis:  [place]-3.3 Overweight/obesity related to past poor dietary habits and physical inactivity as evidenced by patient w/ recent RYGB surgery following dietary guidelines for continued weight loss.    Intervention:  Nutrition counseling provided. Goals:  Follow Phase 3B: High Protein + Non-Starchy Vegetables  Eat 3-6 small meals/snacks, every 3-5 hrs  Increase lean protein foods to meet 80g goal  Increase fluid intake to 64oz +  Avoid drinking 15 minutes before, during and 30 minutes after eating  Continue physical activity daily  Eat protein foods first, then non starchy vegetable, and then carbs if you are hungry  Teaching Method Utilized:  Visual Auditory Hands on  Barriers to learning/adherence to lifestyle change: none  Demonstrated degree of understanding via:  Teach Back   Monitoring/Evaluation:  Dietary intake, exercise,and body weight. Follow up in 6 months for 10 month post-op visit.

## 2014-07-31 ENCOUNTER — Other Ambulatory Visit: Payer: Self-pay | Admitting: Interventional Cardiology

## 2014-08-01 ENCOUNTER — Ambulatory Visit: Payer: BLUE CROSS/BLUE SHIELD | Admitting: Interventional Cardiology

## 2014-08-03 ENCOUNTER — Other Ambulatory Visit: Payer: Self-pay | Admitting: Interventional Cardiology

## 2014-10-14 ENCOUNTER — Other Ambulatory Visit: Payer: Self-pay | Admitting: Interventional Cardiology

## 2014-10-15 NOTE — Progress Notes (Signed)
Cardiology Office Note   Date:  10/17/2014   ID:  Maurice Johnston, DOB Jan 12, 1961, MRN [id number]  PCP:  Emeterio Reeve, MD  Cardiologist:  Lesleigh Noe, MD   Chief Complaint  Patient presents with  . Coronary Artery Disease      History of Present Illness: Maurice Johnston is a 54 y.o. male who presents for obesity, orally controlled diabetes, three-vessel coronary disease treated with culprit lesion PCI with DES to circumflex, essential hypertension, hyperlipidemia, and obesity. He is status post bariatric surgery and has lost greater than 50 pounds.  No angina or dyspnea to suggest progression of coronary disease or diastolic heart failure. He is tan active. He is working without limitations. He denies nitroglycerin use. Many of his medications of been discontinued including lipid therapy.     Past Medical History  Diagnosis Date  . Diabetes mellitus without complication   . Hypertension   . Hypercholesterolemia   . Hyperlipidemia LDL goal < 70   . Unstable angina pectoris     Onset 08/28/2012. Severe multivessel diabetic diffuse 3VCAD   . Morbid obesity   . Pleuritic chest pain     Left lower chest / RUQ pain worse with laying flat - ?Musculoskeletal versus pleurisy/pericarditis vs other   . Splenomegaly     Maybe associated with thrombocytopenia   . Thrombocytopenia   . Family history of adverse reaction to anesthesia     mother- nausea and slow to wake up   . History of coronary artery stent placement   . GERD (gastroesophageal reflux disease)     Past Surgical History  Procedure Laterality Date  . Vasectomy    . Carotid stent    . Breath tek h pylori N/A 12/12/2013    Procedure: BREATH TEK H PYLORI;  Surgeon: Glenna Fellows, MD;  Location: [facility];  Service: General;  Laterality: N/A;  . 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 Cholecystectomy N/A 01/30/2014    Procedure: LAPAROSCOPIC CHOLECYSTECTOMY WITH INTRAOPERATIVE CHOLANGIOGRAM;  Surgeon: Glenna Fellows, MD;  Location: [facility] ORS;  Service: General;  Laterality: N/A;  . Left heart catheterization with coronary angiogram N/A 09/02/2012    Procedure: LEFT HEART CATHETERIZATION WITH CORONARY ANGIOGRAM;  Surgeon: Lesleigh Noe, MD;  Location: [facility] C[facility] LAB;  Service: Cardiovascular;  Laterality: N/A;  . Percutaneous coronary stent intervention (pci-s) N/A 09/06/2012    Procedure: PERCUTANEOUS CORONARY STENT INTERVENTION (PCI-S);  Surgeon: Lesleigh Noe, MD;  Location: [facility] CATH LAB;  Service: Cardiovascular;  Laterality: N/A;     Current Outpatient Prescriptions  Medication Sig Dispense Refill  . aspirin EC 81 MG tablet Take 81 mg by mouth at bedtime.     . clopidogrel (PLAVIX) 75 MG tablet Take 75 mg by mouth at bedtime.    . Cyanocobalamin (NASCOBAL) 500 MCG/0.1ML SOLN Place into the nose. Once a week to start after surgery    . escitalopram (LEXAPRO) 10 MG tablet Take 10 mg by mouth daily.  4  . fluticasone (FLONASE) 50 MCG/ACT nasal spray Place 2 sprays into both nostrils daily as needed for allergies or rhinitis.     . metoprolol tartrate (LOPRESSOR) 25 MG tablet TAKE 1 TABLET BY MOUTH TWICE A DAY 60 tablet 2  . Multiple Vitamin (MULTIVITAMIN WITH MINERALS) TABS tablet Take 1 tablet by mouth every morning.    Marland Kitchen  nitroGLYCERIN (NITROSTAT) 0.4 MG SL tablet Place 1 tablet (0.4 mg total) under the tongue every 5 (five) minutes x 3 doses as needed for chest pain. 25 tablet 12  . telmisartan (MICARDIS) 40 MG tablet Take 40 mg by mouth every evening.     . temazepam (RESTORIL) 30 MG capsule Take 30 mg by mouth at bedtime as needed for sleep.     Marland Kitchen VIAGRA 100 MG tablet Take 50 mg by mouth daily as needed. (erectile dysfunction)  5   No current facility-administered medications for this visit.    Allergies:   Review of  patient's allergies indicates no known allergies.    Social History:  The patient  reports that he has never smoked. He has never used smokeless tobacco. He reports that he drinks alcohol. He reports that he does not use illicit drugs.   Family History:  The patient's family history includes Alcoholism in his father; Cancer in his mother; Cancer - Colon in his father; Diabetes Mellitus II in his mother; Diverticulitis in his mother; Heart attack in his paternal grandfather; Heart disease in his mother; Hypertension in his mother; Kidney disease in his mother.    ROS:  Please see the history of present illness.   Otherwise, review of systems are positive for occasional depression and dizziness..   All other systems are reviewed and negative.    PHYSICAL EXAM: VS:  BP 96/58 mmHg  Pulse 55  Ht  (1.778 m)  Wt 78.019 kg (172 lb)  BMI 24.68 kg/m2 , BMI Body mass index is 24.68 kg/(m^2). GEN: Well nourished, well developed, in no acute distress HEENT: normal Neck: no JVD, carotid bruits, or masses Cardiac: RRR; no murmurs, rubs, or gallops,no edema  Respiratory:  clear to auscultation bilaterally, normal work of breathing GI: soft, nontender, nondistended, + BS MS: no deformity or atrophy Skin: warm and dry, no rash Neuro:  Strength and sensation are intact Psych: euthymic mood, full affect   EKG:  EKG is ordered today. The ekg ordered today demonstrates sinus bradycardia and otherwise normal   Recent Labs: 10/28/2013: TSH 0.795 01/31/2014: ALT 89*; BUN 13; Creatinine, Ser 0.78; Potassium 4.3; Sodium 139 02/01/2014: Hemoglobin 13.3; Platelets 146*    Lipid Panel    Component Value Date/Time   CHOL 115 10/28/2013 1456   TRIG 100 10/28/2013 1456   HDL 34* 10/28/2013 1456   CHOLHDL 3.4 10/28/2013 1456   VLDL 20 10/28/2013 1456   LDLCALC 61 10/28/2013 1456      Wt Readings from Last 3 Encounters:  10/17/14 78.019 kg (172 lb)  05/31/14 87.317 kg (192 lb 8 oz)  03/27/14  95.255 kg (210 lb)      Other studies Reviewed: Additional studies/ records that were reviewed today include: . Review of the above records demonstrates: Encourage sharing of information with Dr. Laurine Blazer. I am resuming lipid therapy today and we'll hope to have a lipid panel done in 6-8 weeks. Echo be done here or with Dr. Laurine Blazer.   ASSESSMENT AND PLAN:  1. Atherosclerosis of native coronary artery without angina pectoris, unspecified whether native or transplanted heart  Asymptomatic. Stenting occurred greater than 2 years ago. We will discontinue Plavix and continue aspirin.  2. Essential hypertension  Excellent control with weight loss  3. Hyperlipidemia with target LDL less than 70  Current status unknown  4. Morbid obesity  Greater than 50 pound weight loss after bariatric surgery     Current medicines are reviewed at length with the  patient today.  The patient does not have concerns regarding medicines.  The following changes have been made:  Statin therapy needs to be resumed to take advantage of the anti-inflammatory impact as well as lipid lowering. We will resume atorvastatin 10 mg daily.  Labs/ tests ordered today include:   Orders Placed This Encounter  Procedures  . EKG 12-Lead     Disposition:   FU with HS in 1 year  Signed, Lesleigh Noe, MD  10/17/2014 9:26 AM    [facility] [address] [place], [place], [place]  [address] Phone: [phone]; Fax: [phone]

## 2014-10-17 ENCOUNTER — Encounter: Payer: Self-pay | Admitting: Interventional Cardiology

## 2014-10-17 ENCOUNTER — Ambulatory Visit (INDEPENDENT_AMBULATORY_CARE_PROVIDER_SITE_OTHER): Payer: BLUE CROSS/BLUE SHIELD | Admitting: Interventional Cardiology

## 2014-10-17 VITALS — BP 96/58 | HR 55 | Ht 70.0 in | Wt 172.0 lb

## 2014-10-17 DIAGNOSIS — I2510 Atherosclerotic heart disease of native coronary artery without angina pectoris: Secondary | ICD-10-CM

## 2014-10-17 DIAGNOSIS — E785 Hyperlipidemia, unspecified: Secondary | ICD-10-CM | POA: Diagnosis not present

## 2014-10-17 DIAGNOSIS — I10 Essential (primary) hypertension: Secondary | ICD-10-CM

## 2014-10-17 MED ORDER — ATORVASTATIN CALCIUM 10 MG PO TABS: 10.0000 mg | ORAL_TABLET | Freq: Every day | ORAL | Status: DC

## 2014-10-17 NOTE — Patient Instructions (Addendum)
Medication Instructions:  Your physician has recommended you make the following change in your medication:  1)RESUME Atorvastatin  daily 2)STOP Plavix  Labwork:  Your physician recommends that you return for a FASTING lipid profile and alt in 8 weeks  Testing/Procedures: None   Follow-Up: Your physician wants you to follow-up in: 1 year with Dr.Smith You will receive a reminder letter in the mail two months in advance. If you don't receive a letter, please call our office to schedule the follow-up appointment.   Any Other Special Instructions Will Be Listed Below (If Applicable). It labs are drawn by Dr.Wolters in Sept/Oct please have a copy of the results forwarded to our office

## 2014-10-30 ENCOUNTER — Other Ambulatory Visit: Payer: Self-pay | Admitting: Interventional Cardiology

## 2014-12-06 ENCOUNTER — Ambulatory Visit: Payer: BLUE CROSS/BLUE SHIELD | Admitting: Dietician

## 2014-12-22 ENCOUNTER — Encounter: Payer: Self-pay | Admitting: Interventional Cardiology

## 2015-02-23 IMAGING — US US ABDOMEN COMPLETE
1 series · 13 of 25 positions shown · non-contrast
Comparison: CT abdomen and pelvis September 03, 2012

CLINICAL DATA: Morbid obesity; preoperative bariatric surgery
evaluation

EXAM:
ULTRASOUND ABDOMEN COMPLETE

[Series 1: us abdomen complete · 13 of 77 slices shown]
[im 1/77]
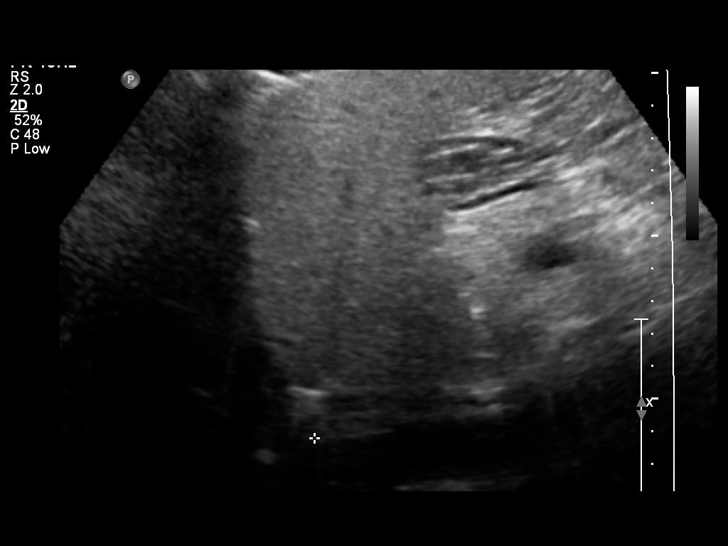
[im 7/77]
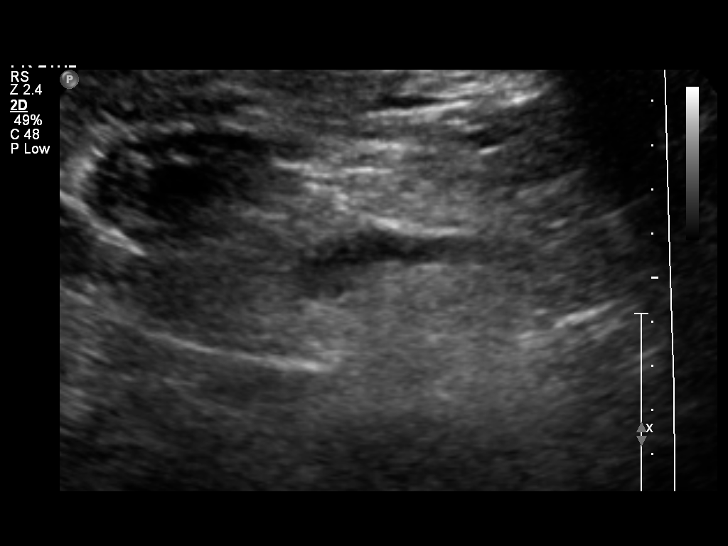
[im 13/77]
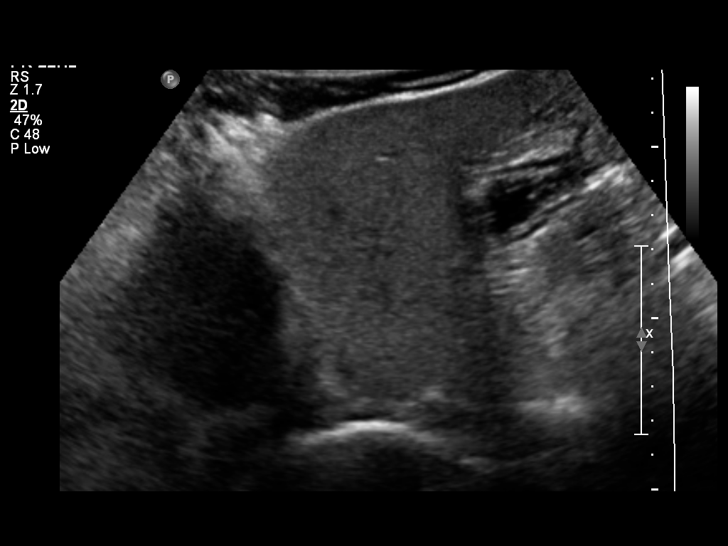
[im 20/77]
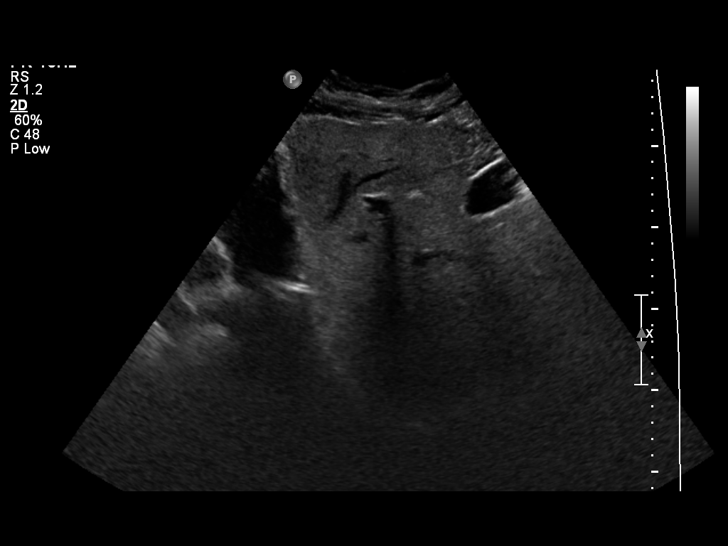
[im 26/77]
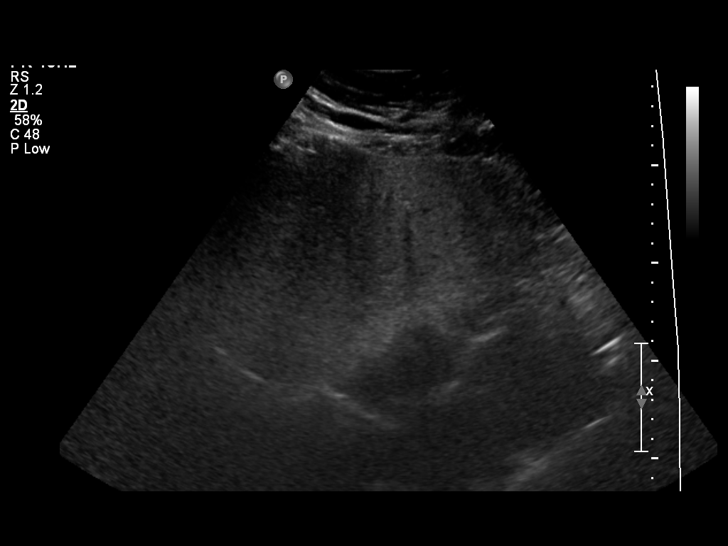
[im 32/77]
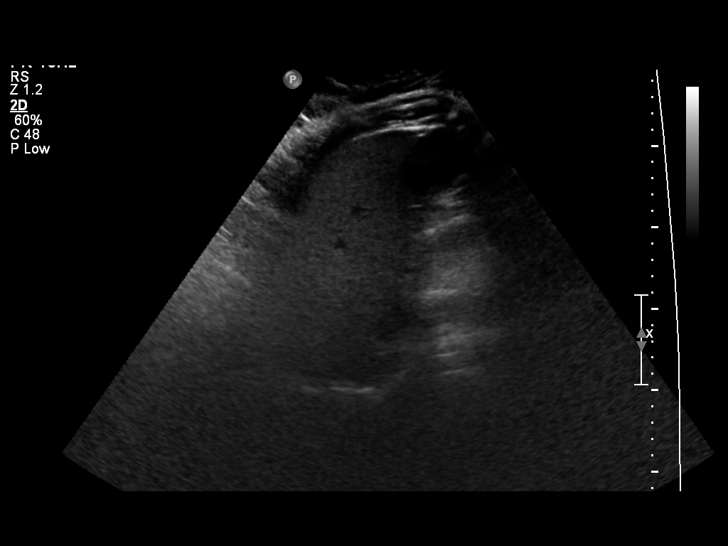
[im 39/77]
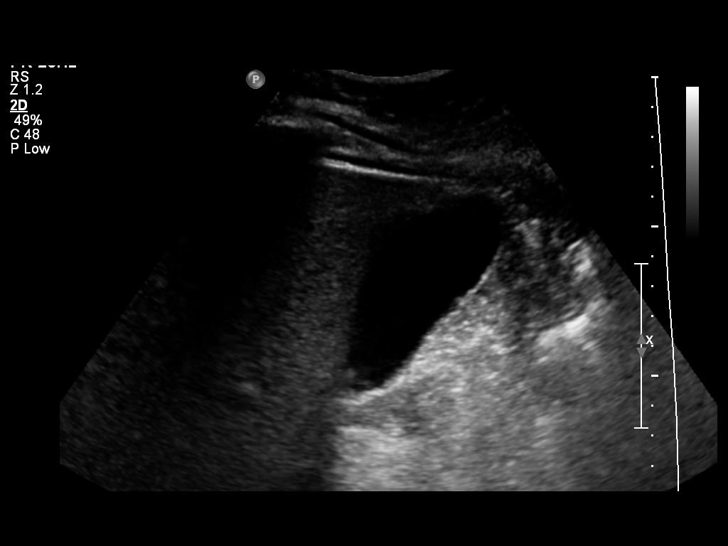
[im 45/77]
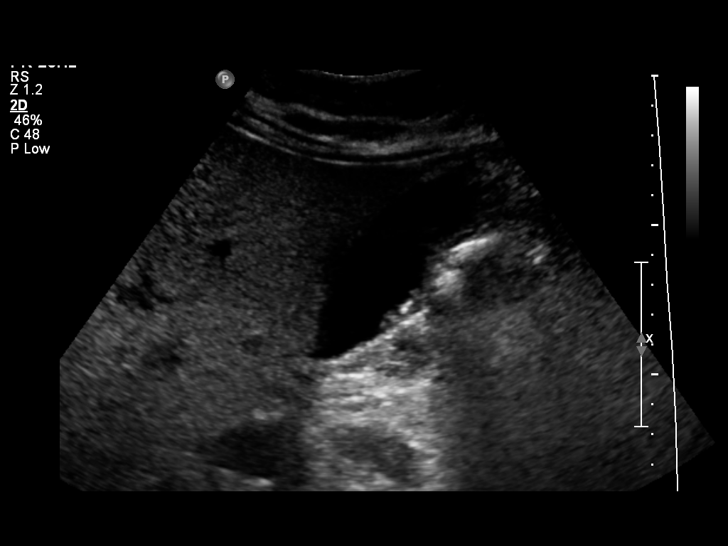
[im 51/77]
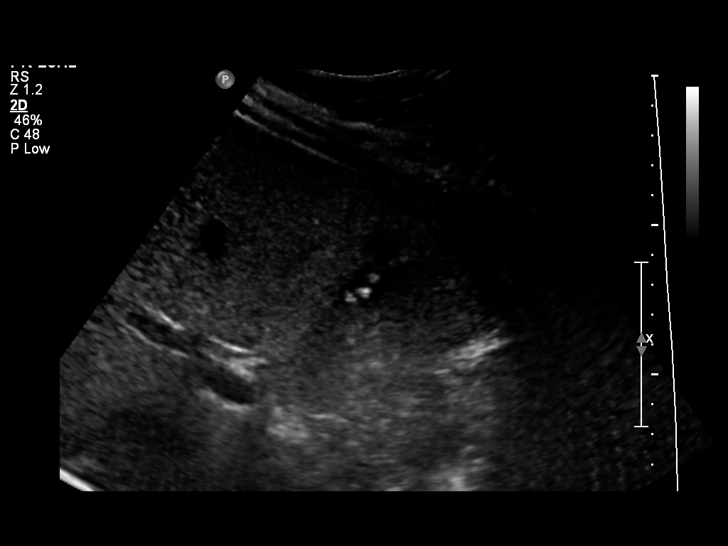
[im 58/77]
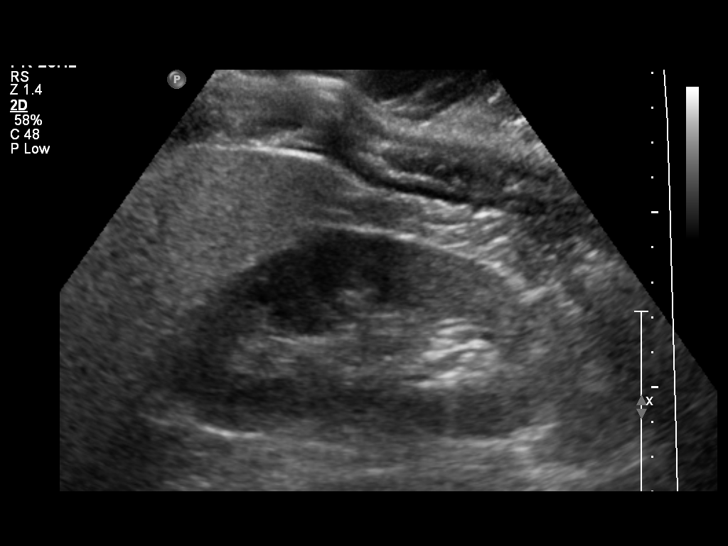
[im 64/77]
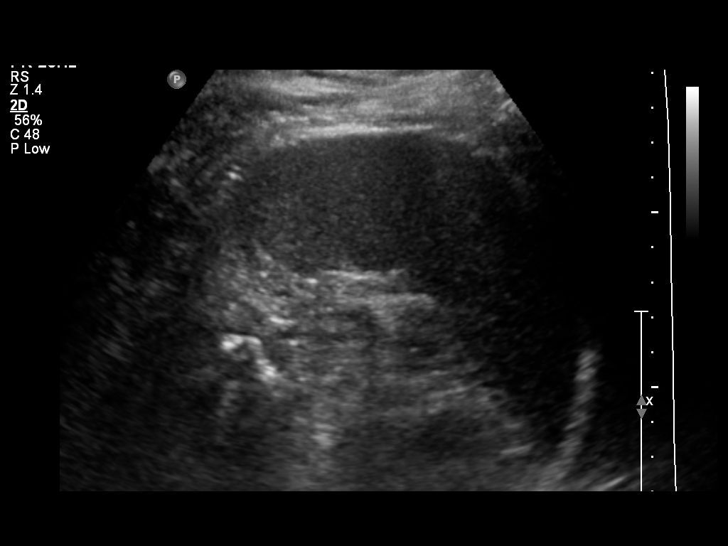
[im 70/77]
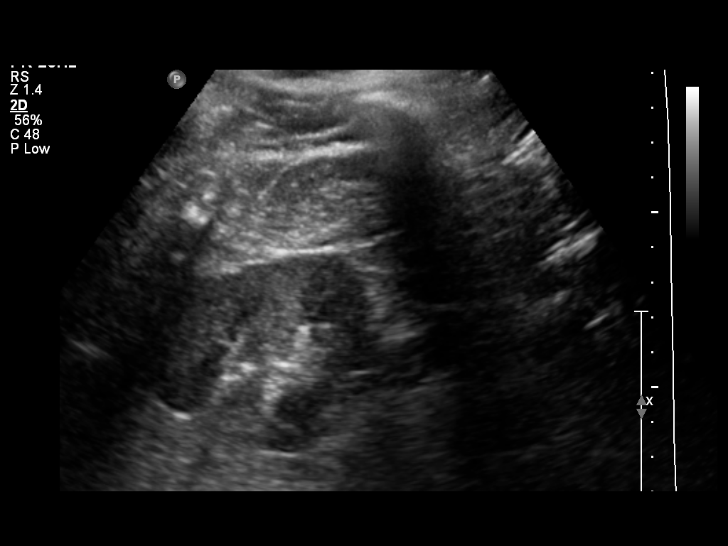
[im 77/77]
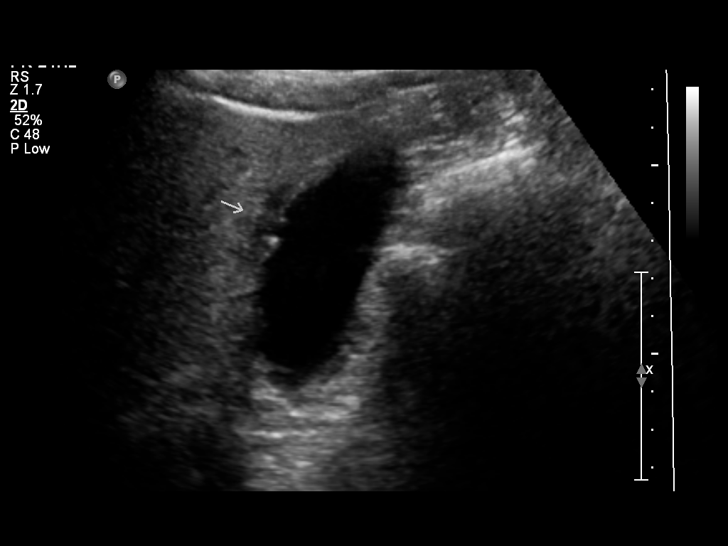

[13 of 25 positions shown; findings below may reference images not displayed]

FINDINGS: Gallbladder:

Within the gallbladder, there are several small echogenic foci which
move and shadow consistent with gallstones. The largest of these
echogenic foci measures 4 mm in length. There is no gallbladder wall
thickening or pericholecystic fluid. No sonographic Murphy sign
noted.

Common bile duct:

Diameter: 4 mm. There is no intrahepatic, common hepatic, or common
bile duct dilatation.

Liver:

No focal lesion identified. The liver has diffuse increased
echogenicity consistent with hepatic steatosis. An area of
relatively normal echogenicity near the gallbladder fossa most
likely represents localized fatty sparing.

IVC:

No abnormality visualized.

Pancreas:

No mass or inflammatory focus.

Spleen:

Size and appearance within normal limits.

Right Kidney:

Length: 13.3 cm. Echogenicity within normal limits. No mass or
hydronephrosis visualized.

Left Kidney:

Length: 13.4 cm. Echogenicity within normal limits. No mass or
hydronephrosis visualized.

Abdominal aorta:

No aneurysm visualized.

Other findings:

No demonstrable ascites.
IMPRESSION: Cholelithiasis.

Diffuse increased liver echogenicity consistent with hepatic
steatosis. Probable mild fatty sparing near the gallbladder fossa.
While no focal liver lesions beyond apparent fatty sparing
identified, it must be cautioned that the sensitivity of ultrasound
for focal liver lesions is diminished in this circumstance.

Study otherwise unremarkable.

## 2015-04-27 IMAGING — RF DG CHOLANGIOGRAM OPERATIVE
1 series · 4 of 4 positions shown · non-contrast
Comparison: None.

CLINICAL DATA: Right upper quadrant pain

EXAM:
INTRAOPERATIVE CHOLANGIOGRAM
TECHNIQUE: Cholangiographic images from the C-arm fluoroscopic device were
submitted for interpretation post-operatively. Please see the
procedural report for the amount of contrast and the fluoroscopy
time utilized.

[Series 1: run · 4 of 92 frames shown]
[frame 14/92]
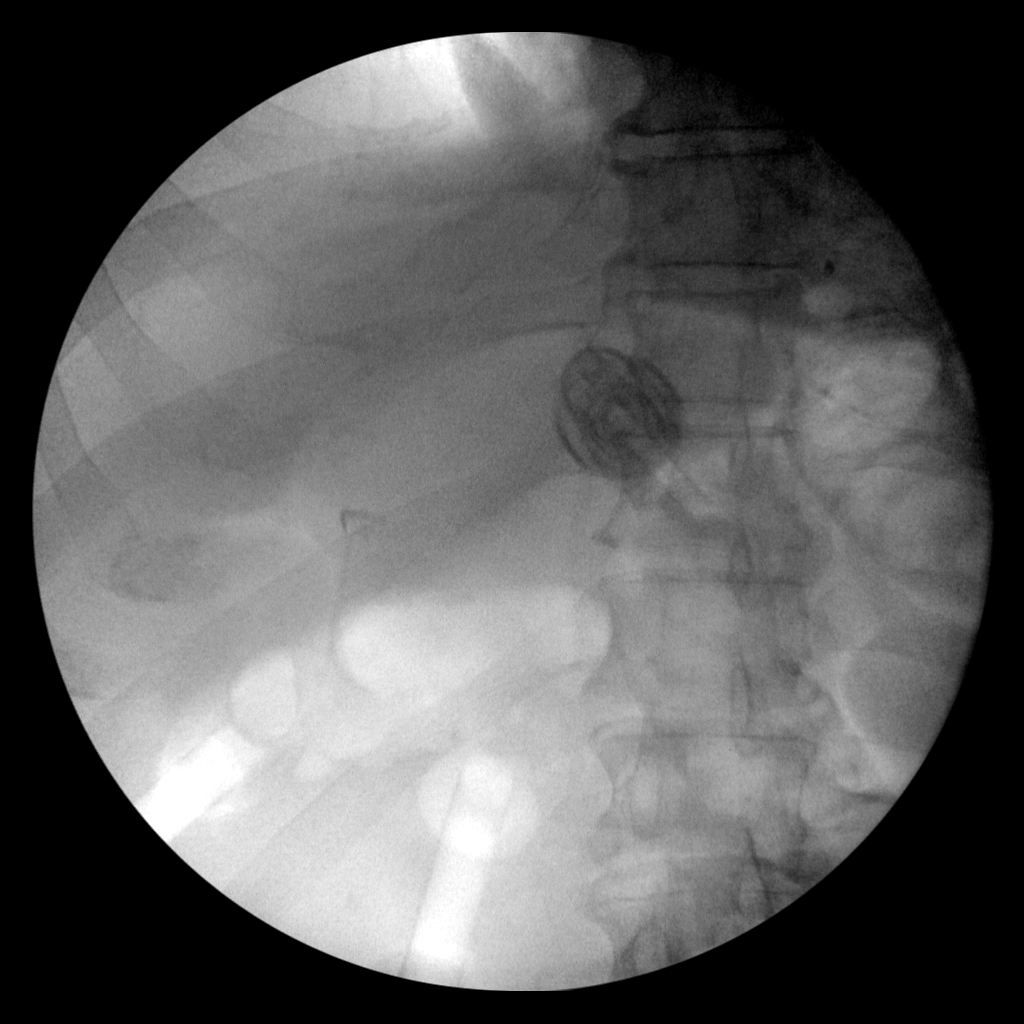
[frame 25/92]
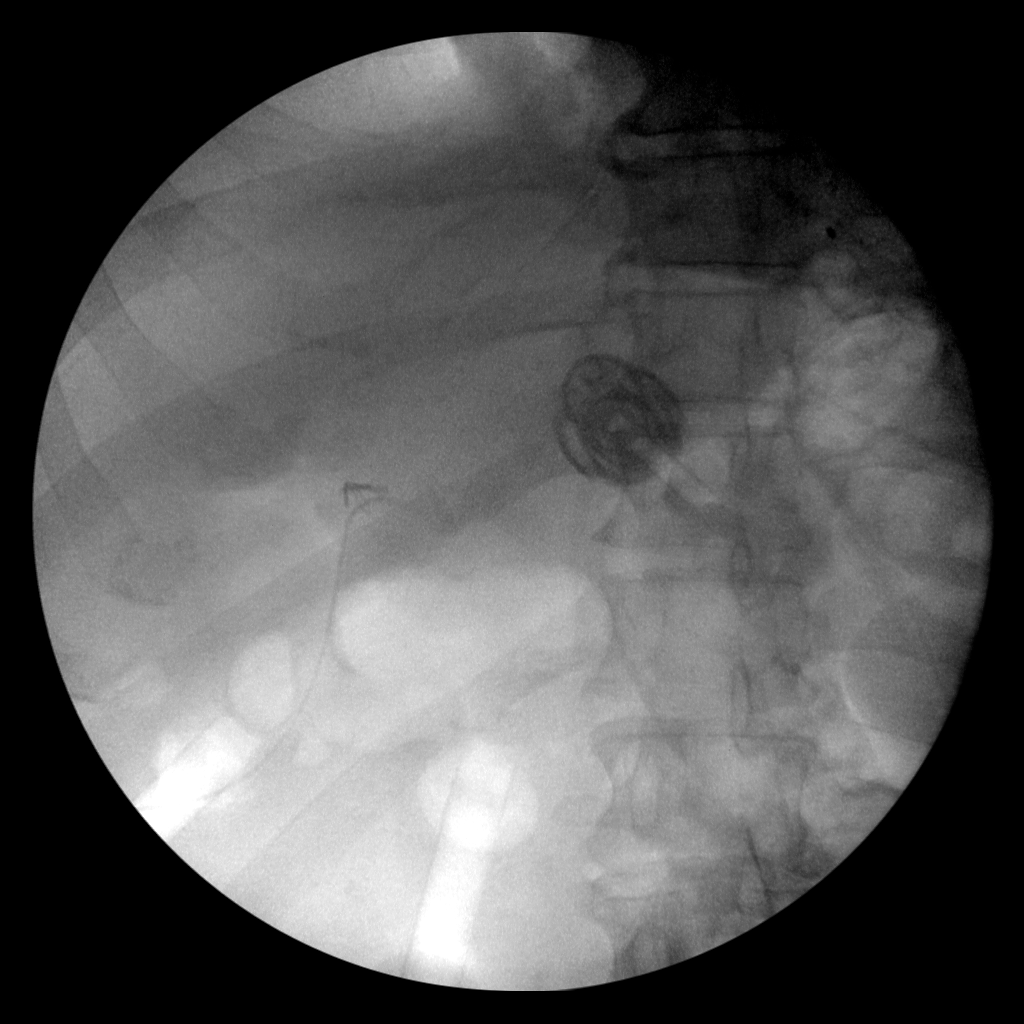
[frame 47/92]
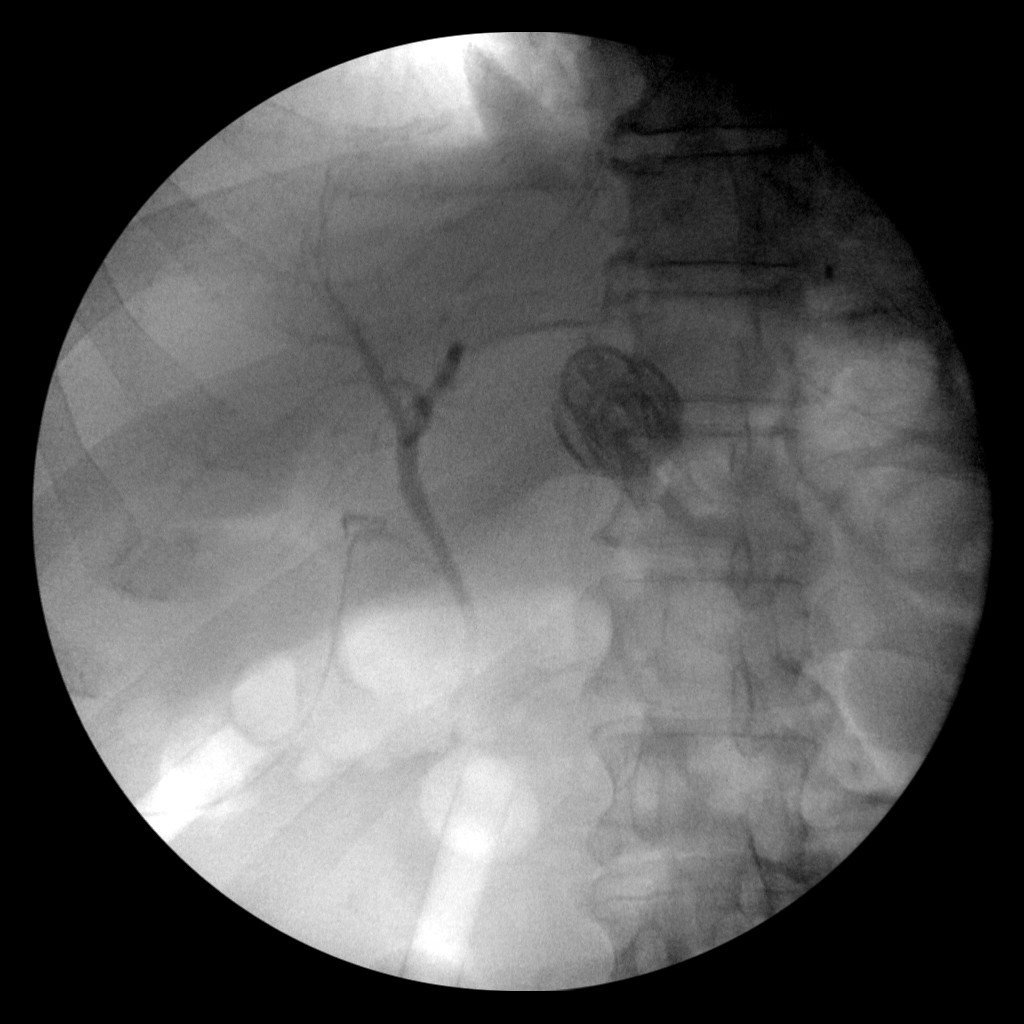
[frame 79/92]
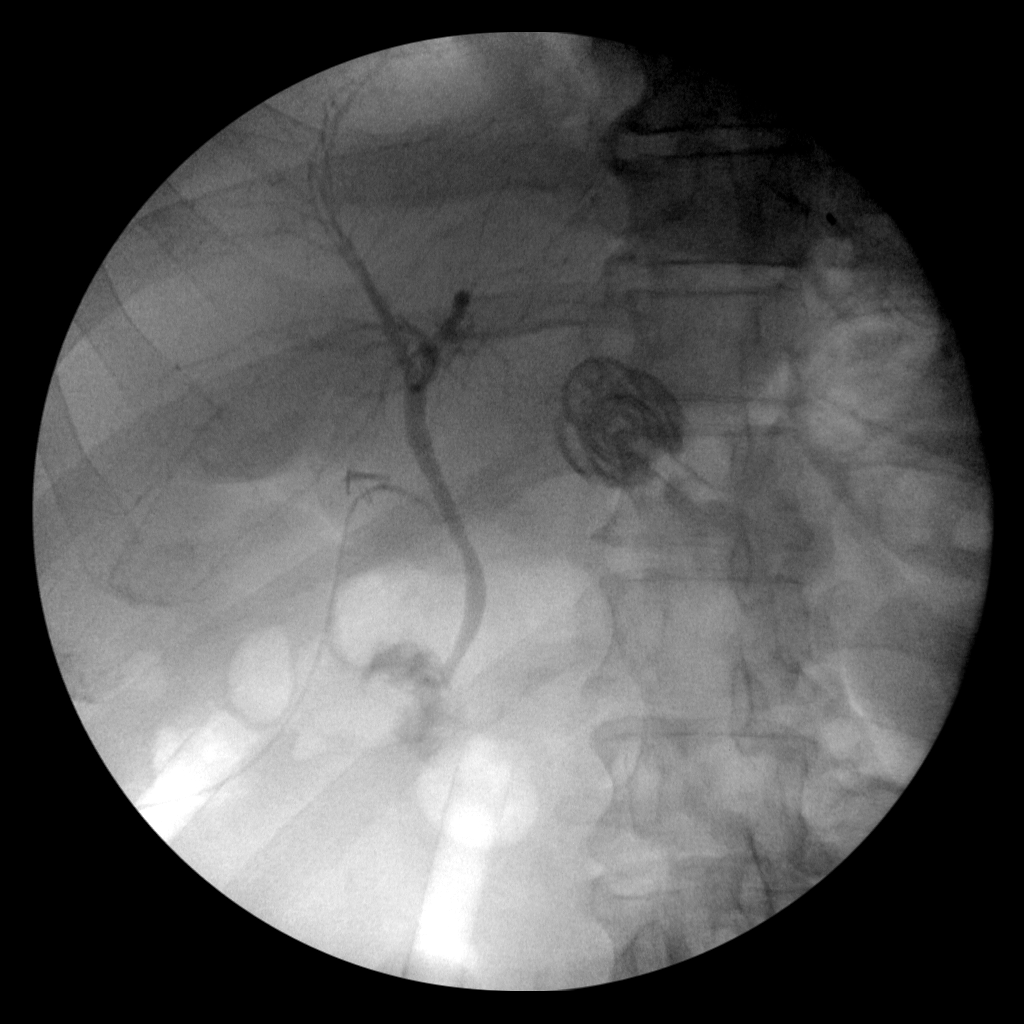

[4 of 4 positions shown; findings below may reference images not displayed]

FINDINGS: Contrast fills the biliary tree and duodenum without filling defects
in the common bile duct.
IMPRESSION: Patent biliary tree without evidence of common bile duct stones.

## 2015-06-03 ENCOUNTER — Other Ambulatory Visit: Payer: Self-pay | Admitting: Interventional Cardiology

## 2015-06-22 DIAGNOSIS — H5213 Myopia, bilateral: Secondary | ICD-10-CM | POA: Diagnosis not present

## 2015-07-03 DIAGNOSIS — E785 Hyperlipidemia, unspecified: Secondary | ICD-10-CM | POA: Diagnosis not present

## 2015-07-03 DIAGNOSIS — Z9884 Bariatric surgery status: Secondary | ICD-10-CM | POA: Diagnosis not present

## 2015-07-03 DIAGNOSIS — E1122 Type 2 diabetes mellitus with diabetic chronic kidney disease: Secondary | ICD-10-CM | POA: Diagnosis not present

## 2015-08-07 ENCOUNTER — Other Ambulatory Visit: Payer: Self-pay | Admitting: *Deleted

## 2015-08-07 MED ORDER — METOPROLOL TARTRATE 25 MG PO TABS: 25.0000 mg | ORAL_TABLET | Freq: Two times a day (BID) | ORAL | Status: DC

## 2015-10-10 ENCOUNTER — Other Ambulatory Visit: Payer: Self-pay | Admitting: Interventional Cardiology

## 2015-10-10 NOTE — Telephone Encounter (Signed)
[facility] Reports   Date/Time Report Action User  10/17/2014 9:31 AM After Visit Summary Printed Jarvis Newcomer, CMA  Patient Instructions   Medication Instructions:  Your physician has recommended you make the following change in your medication:  1)RESUME Atorvastatin 10mg  daily 2)STOP Plavix

## 2015-11-04 ENCOUNTER — Other Ambulatory Visit: Payer: Self-pay | Admitting: Interventional Cardiology

## 2015-11-28 ENCOUNTER — Other Ambulatory Visit: Payer: Self-pay | Admitting: Interventional Cardiology

## 2015-12-03 ENCOUNTER — Other Ambulatory Visit: Payer: Self-pay | Admitting: Interventional Cardiology

## 2015-12-14 ENCOUNTER — Other Ambulatory Visit: Payer: Self-pay | Admitting: *Deleted

## 2015-12-14 MED ORDER — ATORVASTATIN CALCIUM 10 MG PO TABS: 10.0000 mg | ORAL_TABLET | Freq: Every day | ORAL | 0 refills | Status: DC

## 2015-12-14 NOTE — Telephone Encounter (Signed)
atorvastatin (LIPITOR) 10 MG tablet  Medication  Date: 11/28/2015 [facility] [address] Ordering/Authorizing: Lyn RecordsHenry W Smith, MD  Order Providers   Prescribing Provider Encounter Provider  Lyn RecordsHenry W Smith, MD Lyn RecordsHenry W Smith, MD  Medication Detail    Disp Refills Start End   atorvastatin (LIPITOR) 10 MG tablet 30 tablet 0 11/28/2015    Sig - Route: TAKE 1 TABLET (10 MG TOTAL) BY MOUTH DAILY. - Oral   Notes to Pharmacy: Please call [phone] to schedule an appointment for additional refills thanks (2nd attempt)   E-Prescribing Status: Receipt confirmed by pharmacy (11/28/2015 12:36 PM EDT)   Pharmacy   [id number] [place], [place] - [address]   3RD & FINAL ATTEMPT BEING SENT

## 2015-12-28 ENCOUNTER — Encounter: Payer: Self-pay | Admitting: Physician Assistant

## 2015-12-28 ENCOUNTER — Ambulatory Visit (INDEPENDENT_AMBULATORY_CARE_PROVIDER_SITE_OTHER): Payer: BLUE CROSS/BLUE SHIELD | Admitting: Physician Assistant

## 2015-12-28 VITALS — BP 118/74 | HR 50 | Ht 71.0 in | Wt 192.4 lb

## 2015-12-28 DIAGNOSIS — E118 Type 2 diabetes mellitus with unspecified complications: Secondary | ICD-10-CM | POA: Diagnosis not present

## 2015-12-28 DIAGNOSIS — E785 Hyperlipidemia, unspecified: Secondary | ICD-10-CM

## 2015-12-28 DIAGNOSIS — I2510 Atherosclerotic heart disease of native coronary artery without angina pectoris: Secondary | ICD-10-CM

## 2015-12-28 DIAGNOSIS — I10 Essential (primary) hypertension: Secondary | ICD-10-CM | POA: Diagnosis not present

## 2015-12-28 MED ORDER — ATORVASTATIN CALCIUM 10 MG PO TABS: 10.0000 mg | ORAL_TABLET | Freq: Every day | ORAL | 3 refills | Status: DC

## 2015-12-28 MED ORDER — METOPROLOL TARTRATE 25 MG PO TABS: 25.0000 mg | ORAL_TABLET | Freq: Two times a day (BID) | ORAL | 3 refills | Status: DC

## 2015-12-28 MED ORDER — NITROGLYCERIN 0.4 MG SL SUBL: 0.4000 mg | SUBLINGUAL_TABLET | SUBLINGUAL | 12 refills | Status: DC | PRN

## 2015-12-28 NOTE — Patient Instructions (Addendum)
Medication Instructions:  Your physician recommends that you continue on your current medications as directed. Please refer to the Current Medication list given to you today.   Labwork: TODAY:  Testing/Procedures: None ordered  Follow-Up: Your physician wants you to follow-up in: 1 YEAR WITH DR. Marlou StarksSMITH You will receive a reminder letter in the mail two months in advance. If you don't receive a letter, please call our office to schedule the follow-up appointment.   Any Other Special Instructions Will Be Listed Below (If Applicable).     If you need a refill on your cardiac medications before your next appointment, please call your pharmacy.

## 2015-12-28 NOTE — Progress Notes (Signed)
Cardiology Office Note    Date:  12/28/2015   ID:  [name], DOB 24-Dec-1960, MRN [id number]  PCP:  Emeterio Reeve, MD  Cardiologist:  Dr. Katrinka Blazing   CC: yearly follow up   History of Present Illness:  Maurice Johnston is a 55 y.o. 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   He last saw Dr. Katrinka Blazing in 10/2014 for follow up. He was doing well but noted to not be on his statin. Atorvastatin 10mg  daily was added back to his regimen. Plavix was also discontinued at that time given that he was 2 years out from stent placement.   Today he presents to clinic for follow up. He is very active in the year and hikes often. He is in the process of moving to [place] so hasn't been exercising as much. Every once in a while he gets a little pressure in his chest when he is really tired from a long day or sometimes when he is active. It lasts for a few seconds and goes away. No associated SOB. No exertional dyspnea or decreased exercise tolerance. No LE edema, orthopnea or PND. No dizziness or syncope. No palpitation. No blood in his stool or urine. Every once in a while he will have some pain in his upper thighs that he thinks are related to his hips. No pain in legs with exertion.   Past Medical History:  Diagnosis Date  . Diabetes mellitus without complication (HCC)   . Family history of adverse reaction to anesthesia    mother- nausea and slow to wake up   . GERD (gastroesophageal reflux disease)   . History of coronary artery stent placement   . Hypercholesterolemia   . Hyperlipidemia LDL goal < 70   . Hypertension   . Morbid obesity (HCC)   . Pleuritic chest pain    Left lower chest / RUQ pain worse with laying flat - ?Musculoskeletal versus pleurisy/pericarditis vs other   . Splenomegaly    Maybe associated with thrombocytopenia    . Thrombocytopenia (HCC)   . Unstable angina pectoris (HCC)    Onset 08/28/2012. Severe multivessel diabetic diffuse 3VCAD     Past Surgical History:  Procedure Laterality Date  . BREATH TEK H PYLORI N/A 12/12/2013   Procedure: BREATH TEK H PYLORI;  Surgeon: Glenna Fellows, MD;  Location: [facility];  Service: General;  Laterality: N/A;  . CAROTID STENT    . CHOLECYSTECTOMY N/A 01/30/2014   Procedure: LAPAROSCOPIC CHOLECYSTECTOMY WITH INTRAOPERATIVE CHOLANGIOGRAM;  Surgeon: Glenna Fellows, MD;  Location: [facility];  Service: General;  Laterality: N/A;  . GASTRIC ROUX-EN-Y N/A 01/30/2014   Procedure: LAPAROSCOPIC ROUX-EN-Y GASTRIC BYPASS WITH UPPER ENDOSCOPY;  Surgeon: Glenna Fellows, MD;  Location: [facility];  Service: General;  Laterality: N/A;  . LEFT HEART CATHETERIZATION WITH CORONARY ANGIOGRAM N/A 09/02/2012   Procedure: LEFT HEART CATHETERIZATION WITH CORONARY ANGIOGRAM;  Surgeon: Lesleigh Noe, MD;  Location: [facility];  Service: Cardiovascular;  Laterality: N/A;  . PERCUTANEOUS CORONARY STENT INTERVENTION (PCI-S) N/A 09/06/2012   Procedure: PERCUTANEOUS CORONARY STENT INTERVENTION (PCI-S);  Surgeon: Lesleigh Noe, MD;  Location: [facility];  Service: Cardiovascular;  Laterality: N/A;  . VASECTOMY      Current Medications: Outpatient Medications Prior to Visit  Medication Sig Dispense Refill  . aspirin EC 81 MG tablet  Take 81 mg by mouth at bedtime.     Marland Kitchen. atorvastatin (LIPITOR) 10 MG tablet Take 1 tablet (10 mg total) by mouth daily. 15 tablet 0  . escitalopram (LEXAPRO) 10 MG tablet Take 10 mg by mouth daily.  4  . fluticasone (FLONASE) 50 MCG/ACT nasal spray Place 2 sprays into both nostrils daily as needed for allergies or rhinitis.     . Multiple Vitamin (MULTIVITAMIN WITH MINERALS) TABS tablet Take 1 tablet by mouth every morning.    . nitroGLYCERIN (NITROSTAT) 0.4 MG SL tablet Place 1 tablet (0.4 mg total) under the tongue every 5 (five) minutes x 3 doses as  needed for chest pain. 25 tablet 12  . temazepam (RESTORIL) 30 MG capsule Take 30 mg by mouth at bedtime as needed for sleep.     Marland Kitchen. VIAGRA 100 MG tablet Take 50 mg by mouth daily as needed. (erectile dysfunction)  5  . telmisartan (MICARDIS) 40 MG tablet Take 40 mg by mouth every evening.     . clopidogrel (PLAVIX) 75 MG tablet TAKE 1 TABLET BY MOUTH EVERY DAY 30 tablet 11  . Cyanocobalamin (NASCOBAL) 500 MCG/0.1ML SOLN Place into the nose. Once a week to start after surgery    . metoprolol tartrate (LOPRESSOR) 25 MG tablet TAKE 1 TABLET TWICE A DAY 30 tablet 0   No facility-administered medications prior to visit.      Allergies:   Review of patient's allergies indicates no known allergies.   Social History   Social History  . Marital status: Married    Spouse name: N/A  . Number of children: N/A  . Years of education: N/A   Social History Main Topics  . Smoking status: Never Smoker  . Smokeless tobacco: Never Used  . Alcohol use Yes     Comment: occasional  . Drug use: No  . Sexual activity: Not Asked   Other Topics Concern  . None   Social History Narrative  . None     Family History:  The patient's family history includes Alcoholism in his father; Cancer in his mother; Cancer - Colon in his father; Diabetes Mellitus II in his mother; Diverticulitis in his mother; Heart attack in his paternal grandfather; Heart disease in his mother; Hypertension in his mother; Kidney disease in his mother.     ROS:   Please see the history of present illness.    ROS All other systems reviewed and are negative.   PHYSICAL EXAM:   VS:  BP 118/74   Pulse (!) 50   Ht 5\' 11"  (1.803 m)   Wt 192 lb 6.4 oz (87.3 kg)   SpO2 98%   BMI 26.83 kg/m    GEN: Well nourished, well developed, in no acute distress  HEENT: normal  Neck: no JVD, carotid bruits, or masses Cardiac: RRR; no murmurs, rubs, or gallops,no edema  Respiratory:  clear to auscultation bilaterally, normal work of  breathing GI: soft, nontender, nondistended, + BS MS: no deformity or atrophy  Skin: warm and dry, no rash Neuro:  Alert and Oriented x 3, Strength and sensation are intact Psych: euthymic mood, full affect  Wt Readings from Last 3 Encounters:  12/28/15 192 lb 6.4 oz (87.3 kg)  10/17/14 172 lb (78 kg)  05/31/14 192 lb 8 oz (87.3 kg)      Studies/Labs Reviewed:   EKG:  EKG is ordered today.  The ekg ordered today demonstrates sinus bradycardia HR 50.   Recent Labs: No results found  for requested labs within last 8760 hours.   Lipid Panel    Component Value Date/Time   CHOL 115 10/28/2013 1456   TRIG 100 10/28/2013 1456   HDL 34 (L) 10/28/2013 1456   CHOLHDL 3.4 10/28/2013 1456   VLDL 20 10/28/2013 1456   LDLCALC 61 10/28/2013 1456    Additional studies/ records that were reviewed today include:  2D ECHO: 09/02/2012 LV EF: 60% -  65% Study Conclusion - Left ventricle: The cavity size was normal. Wall thickness was increased in a pattern of mild LVH. Systolic function was normal. The estimated ejection fraction was in the range of 60% to 65%. Wall motion was normal; there were no regional wall motion abnormalities. Left ventricular diastolic function parameters were normal. - Right ventricle: The cavity size was mildly dilated.   Cath 09/02/2012 IMPRESSIONS:  1. Class IV angina despite IV nitroglycerin due to high-grade obstruction in the circumflex. The right coronary. LAD, and diagonals contain significant diffuse diabetic disease .  2. Normal left ventricular function  3. Severe left lateral subcostal pain with a pleuritic component. The mother has a hereditary clotting abnormality. We should probably consider musculoskeletal causes for this pain versus pulmonary infarction.  RECOMMENDATION:  1. Surgical versus culprit PCI would be the discussion concerning management of the patient's coronary disease. 2. Continue Lovenox and consider CT angiography  chest to rule out pulmonary infarction/emboli versus other sources of the severe pain in the left flank/subcostal region    PERCUTANEOUS CORONARY INTERVENTION 09/06/12   IMPRESSIONS:  Successful stenting of the MID circumflex from 99% to 0% with TIMI grade 3 flow to  RECOMMENDATION:  Discontinue Lovenox, Angiomax, wean and DC nitroglycerin,. Will go to 6500 and plan on discharge tomorrow a.m.   ASSESSMENT & PLAN:   CAD: continue ASA, statin and BB.  HTN: BP well controlled on current regimen  DMT2: HgA1c 5.9 in 09/2015. He has had a more recent one that was around 6.1 but we don't have these records. Continue diet and exercise  HLD: LDL at goal (46) by lipid panel 09/2014. Followed by PCP. Continue atorvastatin 10mg  daily  Obesity: Body mass index is 26.83 kg/m. Doing great s/p bariatric surgery. Continue great diet and exercise   Medication Adjustments/Labs and Tests Ordered: Current medicines are reviewed at length with the patient today.  Concerns regarding medicines are outlined above.  Medication changes, Labs and Tests ordered today are listed in the Patient Instructions below. Patient Instructions  Medication Instructions:  Your physician recommends that you continue on your current medications as directed. Please refer to the Current Medication list given to you today.   Labwork: TODAY:  Testing/Procedures: None ordered  Follow-Up: Your physician wants you to follow-up in: 1 YEAR WITH DR. Marlou Starks will receive a reminder letter in the mail two months in advance. If you don't receive a letter, please call our office to schedule the follow-up appointment.   Any Other Special Instructions Will Be Listed Below (If Applicable).     If you need a refill on your cardiac medications before your next appointment, please call your pharmacy.      Signed, Cline Crock, PA-C  12/28/2015 2:45 PM    [facility] [address] [place],  [place], [place]  [address] Phone: [phone]; Fax: [phone]

## 2016-01-24 DIAGNOSIS — Z1159 Encounter for screening for other viral diseases: Secondary | ICD-10-CM | POA: Diagnosis not present

## 2016-01-24 DIAGNOSIS — E1122 Type 2 diabetes mellitus with diabetic chronic kidney disease: Secondary | ICD-10-CM | POA: Diagnosis not present

## 2016-01-24 DIAGNOSIS — Z79899 Other long term (current) drug therapy: Secondary | ICD-10-CM | POA: Diagnosis not present

## 2016-01-24 DIAGNOSIS — Z125 Encounter for screening for malignant neoplasm of prostate: Secondary | ICD-10-CM | POA: Diagnosis not present

## 2016-01-24 DIAGNOSIS — Z0000 Encounter for general adult medical examination without abnormal findings: Secondary | ICD-10-CM | POA: Diagnosis not present

## 2016-01-24 DIAGNOSIS — Z23 Encounter for immunization: Secondary | ICD-10-CM | POA: Diagnosis not present

## 2016-01-24 DIAGNOSIS — I2510 Atherosclerotic heart disease of native coronary artery without angina pectoris: Secondary | ICD-10-CM | POA: Diagnosis not present

## 2016-01-24 DIAGNOSIS — Z794 Long term (current) use of insulin: Secondary | ICD-10-CM | POA: Diagnosis not present

## 2016-06-08 DIAGNOSIS — S61215A Laceration without foreign body of left ring finger without damage to nail, initial encounter: Secondary | ICD-10-CM | POA: Diagnosis not present

## 2016-08-23 DIAGNOSIS — H5213 Myopia, bilateral: Secondary | ICD-10-CM | POA: Diagnosis not present

## 2016-09-04 ENCOUNTER — Encounter (HOSPITAL_COMMUNITY): Payer: Self-pay

## 2016-10-01 DIAGNOSIS — S92411A Displaced fracture of proximal phalanx of right great toe, initial encounter for closed fracture: Secondary | ICD-10-CM | POA: Diagnosis not present

## 2016-10-01 DIAGNOSIS — M79674 Pain in right toe(s): Secondary | ICD-10-CM | POA: Diagnosis not present

## 2016-10-03 DIAGNOSIS — M25571 Pain in right ankle and joints of right foot: Secondary | ICD-10-CM | POA: Diagnosis not present

## 2016-10-10 DIAGNOSIS — M25571 Pain in right ankle and joints of right foot: Secondary | ICD-10-CM | POA: Diagnosis not present

## 2016-12-09 DIAGNOSIS — Z23 Encounter for immunization: Secondary | ICD-10-CM | POA: Diagnosis not present

## 2016-12-09 DIAGNOSIS — F329 Major depressive disorder, single episode, unspecified: Secondary | ICD-10-CM | POA: Diagnosis not present

## 2016-12-09 DIAGNOSIS — G4700 Insomnia, unspecified: Secondary | ICD-10-CM | POA: Diagnosis not present

## 2016-12-19 ENCOUNTER — Other Ambulatory Visit: Payer: Self-pay | Admitting: Physician Assistant

## 2016-12-19 DIAGNOSIS — I2510 Atherosclerotic heart disease of native coronary artery without angina pectoris: Secondary | ICD-10-CM

## 2017-01-15 ENCOUNTER — Other Ambulatory Visit: Payer: Self-pay | Admitting: Interventional Cardiology

## 2017-01-15 DIAGNOSIS — I2510 Atherosclerotic heart disease of native coronary artery without angina pectoris: Secondary | ICD-10-CM

## 2017-01-15 MED ORDER — ATORVASTATIN CALCIUM 10 MG PO TABS: 10.0000 mg | ORAL_TABLET | Freq: Every day | ORAL | 0 refills | Status: DC

## 2017-01-15 MED ORDER — METOPROLOL TARTRATE 25 MG PO TABS: 25.0000 mg | ORAL_TABLET | Freq: Two times a day (BID) | ORAL | 0 refills | Status: DC

## 2017-01-20 ENCOUNTER — Other Ambulatory Visit: Payer: Self-pay | Admitting: Physician Assistant

## 2017-01-20 DIAGNOSIS — I2510 Atherosclerotic heart disease of native coronary artery without angina pectoris: Secondary | ICD-10-CM

## 2017-01-20 NOTE — Telephone Encounter (Signed)
Approved    Disp Refills Start End  atorvastatin (LIPITOR) 10 MG tablet 15 tablet 0 01/15/2017   Sig - Route:  Take 1 tablet (10 mg total) by mouth daily. Please make overdue appt with Dr. Katrinka BlazingSmith before anymore refills. 2nd Attempt - Oral  Class:  Normal  DAW:  No  Comment:  Please call our office to schedule an overdue yearly appointment with Dr. Katrinka BlazingSmith before anymore refills. [phone]. Thank you 2nd attempt  Authorizing Provider:  Lyn RecordsHenry W Smith, MD  Ordering User:  Demetrios Lollathy C Barnard  metoprolol tartrate (LOPRESSOR) 25 MG tablet 30 tablet 0 01/15/2017   Sig - Route:  Take 1 tablet (25 mg total) by mouth 2 (two) times daily. Please make overdue appt with Dr.Smith before anymore refills. 2nd attempt - Oral  Class:  Normal  DAW:  No  Comment:  Please call our office to schedule an overdue yearly appointment with Dr. Katrinka BlazingSmith before anymore refills. [phone]. Thank you 2nd attempt  Authorizing Provider:  Lyn RecordsHenry W Smith, MD  Ordering User:  Demetrios Lollathy C Barnard  Visit Pharmacy   [facility] [id number] - [place], [place] - [phone]

## 2017-02-24 ENCOUNTER — Other Ambulatory Visit: Payer: Self-pay | Admitting: Interventional Cardiology

## 2017-02-24 DIAGNOSIS — I2510 Atherosclerotic heart disease of native coronary artery without angina pectoris: Secondary | ICD-10-CM

## 2017-03-02 ENCOUNTER — Other Ambulatory Visit: Payer: Self-pay

## 2017-03-02 DIAGNOSIS — I2510 Atherosclerotic heart disease of native coronary artery without angina pectoris: Secondary | ICD-10-CM

## 2017-03-02 MED ORDER — ATORVASTATIN CALCIUM 10 MG PO TABS: 10.0000 mg | ORAL_TABLET | Freq: Every day | ORAL | 1 refills | Status: DC

## 2017-03-19 DIAGNOSIS — Z0000 Encounter for general adult medical examination without abnormal findings: Secondary | ICD-10-CM | POA: Diagnosis not present

## 2017-03-19 DIAGNOSIS — Z23 Encounter for immunization: Secondary | ICD-10-CM | POA: Diagnosis not present

## 2017-03-19 DIAGNOSIS — E1122 Type 2 diabetes mellitus with diabetic chronic kidney disease: Secondary | ICD-10-CM | POA: Diagnosis not present

## 2017-03-19 DIAGNOSIS — Z125 Encounter for screening for malignant neoplasm of prostate: Secondary | ICD-10-CM | POA: Diagnosis not present

## 2017-03-19 DIAGNOSIS — E1165 Type 2 diabetes mellitus with hyperglycemia: Secondary | ICD-10-CM | POA: Diagnosis not present

## 2017-03-19 DIAGNOSIS — E7800 Pure hypercholesterolemia, unspecified: Secondary | ICD-10-CM | POA: Diagnosis not present

## 2017-03-19 DIAGNOSIS — Z79899 Other long term (current) drug therapy: Secondary | ICD-10-CM | POA: Diagnosis not present

## 2017-04-11 ENCOUNTER — Other Ambulatory Visit: Payer: Self-pay | Admitting: Interventional Cardiology

## 2017-04-11 DIAGNOSIS — I2510 Atherosclerotic heart disease of native coronary artery without angina pectoris: Secondary | ICD-10-CM

## 2017-04-15 NOTE — Progress Notes (Signed)
Cardiology Office Note    Date:  04/16/2017   ID:  Maurice Johnston, DOB March 14, 1961, MRN [id number]  PCP:  [name], MD  Cardiologist: Lesleigh Noe, MD   Chief Complaint  Patient presents with  . Coronary Artery Disease    History of Present Illness:  Maurice Johnston is a 57 y.o. 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   Doing relatively well.  He has not been exercising as much as usual because he dropped a trailer hitch on his big toe, left foot but has not been able to walk without pain.  Is gradually getting better.  No nitroglycerin use.  Denies orthopnea, PND, palpitations, syncope.   Past Medical History:  Diagnosis Date  . Diabetes mellitus without complication (HCC)   . Family history of adverse reaction to anesthesia    mother- nausea and slow to wake up   . GERD (gastroesophageal reflux disease)   . History of coronary artery stent placement   . Hypercholesterolemia   . Hyperlipidemia LDL goal < 70   . Hypertension   . Morbid obesity (HCC)   . Pleuritic chest pain    Left lower chest / RUQ pain worse with laying flat - ?Musculoskeletal versus pleurisy/pericarditis vs other   . Splenomegaly    Maybe associated with thrombocytopenia   . Thrombocytopenia (HCC)   . Unstable angina pectoris (HCC)    Onset 08/28/2012. Severe multivessel diabetic diffuse 3VCAD     Past Surgical History:  Procedure Laterality Date  . BREATH TEK H PYLORI N/A 12/12/2013   Procedure: BREATH TEK H PYLORI;  Surgeon: Glenna Fellows, MD;  Location: [facility];  Service: General;  Laterality: N/A;  . CAROTID STENT    . CHOLECYSTECTOMY N/A 01/30/2014   Procedure: LAPAROSCOPIC CHOLECYSTECTOMY WITH INTRAOPERATIVE CHOLANGIOGRAM;  Surgeon: Glenna Fellows, MD;  Location: [facility];  Service: General;  Laterality: N/A;  .  GASTRIC ROUX-EN-Y N/A 01/30/2014   Procedure: LAPAROSCOPIC ROUX-EN-Y GASTRIC BYPASS WITH UPPER ENDOSCOPY;  Surgeon: Glenna Fellows, MD;  Location: [facility];  Service: General;  Laterality: N/A;  . LEFT HEART CATHETERIZATION WITH CORONARY ANGIOGRAM N/A 09/02/2012   Procedure: LEFT HEART CATHETERIZATION WITH CORONARY ANGIOGRAM;  Surgeon: Lesleigh Noe, MD;  Location: [facility] CATH [facility];  Service: Cardiovascular;  Laterality: N/A;  . PERCUTANEOUS CORONARY STENT INTERVENTION (PCI-S) N/A 09/06/2012   Procedure: PERCUTANEOUS CORONARY STENT INTERVENTION (PCI-S);  Surgeon: Lesleigh Noe, MD;  Location: [facility] CATH LAB;  Service: Cardiovascular;  Laterality: N/A;  . VASECTOMY      Current Medications: Outpatient Medications Prior to Visit  Medication Sig Dispense Refill  . aspirin EC 81 MG tablet Take 81 mg by mouth at bedtime.     Marland Kitchen atenolol (TENORMIN) 25 MG tablet Take 25 mg by mouth daily.    Marland Kitchen atorvastatin (LIPITOR) 10 MG tablet Take 1 tablet (10 mg total) by mouth daily. Please make overdue appt with Dr. Katrinka Blazing before anymore refills. 2nd Attempt 30 tablet 1  . escitalopram (LEXAPRO) 10 MG tablet Take 10 mg by mouth daily.  4  . metFORMIN (GLUCOPHAGE) 500 MG tablet Take 500 mg by mouth 2 (two) times daily.  12  . metoprolol tartrate (LOPRESSOR) 25 MG tablet Take 1 tablet (25 mg total) by mouth 2 (two) times daily. Please keep upcoming appt for future refills. Thank you  10 tablet 0  . Multiple Vitamin (MULTIVITAMIN WITH MINERALS) TABS tablet Take 1 tablet by mouth every morning.    . nitroGLYCERIN (NITROSTAT) 0.4 MG SL tablet Place 1 tablet (0.4 mg total) under the tongue every 5 (five) minutes x 3 doses as needed for chest pain. 25 tablet 12  . temazepam (RESTORIL) 30 MG capsule Take 30 mg by mouth at bedtime as needed for sleep.     Marland Kitchen VIAGRA 100 MG tablet Take 50 mg by mouth daily as needed. (erectile dysfunction)  5  . fluticasone (FLONASE) 50 MCG/ACT nasal spray Place 2 sprays into both nostrils  daily as needed for allergies or rhinitis.      No facility-administered medications prior to visit.      Allergies:   Patient has no known allergies.   Social History   Socioeconomic History  . Marital status: Married    Spouse name: None  . Number of children: None  . Years of education: None  . Highest education level: None  Social Needs  . Financial resource strain: None  . Food insecurity - worry: None  . Food insecurity - inability: None  . Transportation needs - medical: None  . Transportation needs - non-medical: None  Occupational History  . None  Tobacco Use  . Smoking status: Never Smoker  . Smokeless tobacco: Never Used  Substance and Sexual Activity  . Alcohol use: Yes    Comment: occasional  . Drug use: No  . Sexual activity: None  Other Topics Concern  . None  Social History Narrative  . None     Family History:  The patient's family history includes Alcoholism in his father; Cancer in his mother; Cancer - Colon in his father; Diabetes Mellitus II in his mother; Diverticulitis in his mother; Heart attack in his paternal grandfather; Heart disease in his mother; Hypertension in his mother; Kidney disease in his mother.   ROS:   Please see the history of present illness.    Stop taking aspirin.  No particular reason. All other systems reviewed and are negative.   PHYSICAL EXAM:   VS:  BP 136/76   Pulse (!) 52   Ht 5\' 11"  (1.803 m)   Wt 207 lb 6.4 oz (94.1 kg)   BMI 28.93 kg/m    GEN: Well nourished, well developed, in no acute distress  HEENT: normal  Neck: no JVD, carotid bruits, or masses Cardiac: RRR; no murmurs, rubs, or gallops,no edema.  Pulses are absent in the posterior tibial bilaterally and trace to 1+ bilateral in the dorsalis pedis.  There is an absent left popliteal pulse. Respiratory:  clear to auscultation bilaterally, normal work of breathing GI: soft, nontender, nondistended, + BS MS: no deformity or atrophy  Skin: warm and dry,  no rash Neuro:  Alert and Oriented x 3, Strength and sensation are intact Psych: euthymic mood, full affect  Wt Readings from Last 3 Encounters:  04/16/17 207 lb 6.4 oz (94.1 kg)  12/28/15 192 lb 6.4 oz (87.3 kg)  10/17/14 172 lb (78 kg)      Studies/Labs Reviewed:   EKG:  EKG sinus bradycardia, 52 bpm.  EKG is otherwise normal.  Recent Labs: No results found for requested labs within last 8760 hours.   Lipid Panel    Component Value Date/Time   CHOL 115 10/28/2013 1456   TRIG 100 10/28/2013 1456   HDL 34 (L) 10/28/2013 1456   CHOLHDL 3.4 10/28/2013 1456   VLDL 20 10/28/2013 1456  LDLCALC 61 10/28/2013 1456    Additional studies/ records that were reviewed today include:  None    ASSESSMENT:    1. Atherosclerosis of native coronary artery of native heart without angina pectoris   2. Essential hypertension   3. Hyperlipidemia with target LDL less than 70   4. Morbid obesity (HCC)   5. Diabetes mellitus due to underlying condition with stage 3 chronic kidney disease, with long-term current use of insulin (HCC)      PLAN:  In order of problems listed above:  1. Stable without angina.  Relatively sedentary.  No nitroglycerin use.  Aerobic activity.  Clinical follow-up in 1 year. 2. Stable 135/75 mmHg.  This is adequate.  Low-salt diet. 3. LDL target less than 70.  Most recent study performed with his primary physician in January 2019 was LDL 71.  Hemoglobin A1c 7.5. 4. Moderate abdominal obesity. 5. A1c target less than 7.  Discussed.  Improvement will occur with aerobic activity as tolerated.  Recent fracture of his left great toe has prevented walking for exercise.  Clinical follow-up in 1 year.  No change in the current medical regimen.    Medication Adjustments/Labs and Tests Ordered: Current medicines are reviewed at length with the patient today.  Concerns regarding medicines are outlined above.  Medication changes, Labs and Tests ordered today are listed  in the Patient Instructions below. There are no Patient Instructions on file for this visit.   Signed, Lesleigh NoeHenry W Ramonia Mcclaran [name], MD  04/16/2017 5:10 PM    [facility] [address], [place], [place]  [address] Phone: [phone]; Fax: [phone]

## 2017-04-16 ENCOUNTER — Ambulatory Visit: Payer: BLUE CROSS/BLUE SHIELD | Admitting: Interventional Cardiology

## 2017-04-16 ENCOUNTER — Encounter: Payer: Self-pay | Admitting: Interventional Cardiology

## 2017-04-16 VITALS — BP 136/76 | HR 52 | Ht 71.0 in | Wt 207.4 lb

## 2017-04-16 DIAGNOSIS — I10 Essential (primary) hypertension: Secondary | ICD-10-CM

## 2017-04-16 DIAGNOSIS — E0822 Diabetes mellitus due to underlying condition with diabetic chronic kidney disease: Secondary | ICD-10-CM | POA: Diagnosis not present

## 2017-04-16 DIAGNOSIS — Z794 Long term (current) use of insulin: Secondary | ICD-10-CM | POA: Diagnosis not present

## 2017-04-16 DIAGNOSIS — I2510 Atherosclerotic heart disease of native coronary artery without angina pectoris: Secondary | ICD-10-CM | POA: Diagnosis not present

## 2017-04-16 DIAGNOSIS — N183 Chronic kidney disease, stage 3 unspecified: Secondary | ICD-10-CM

## 2017-04-16 DIAGNOSIS — E785 Hyperlipidemia, unspecified: Secondary | ICD-10-CM

## 2017-04-16 MED ORDER — ATORVASTATIN CALCIUM 10 MG PO TABS: 10.0000 mg | ORAL_TABLET | Freq: Every day | ORAL | 3 refills | Status: DC

## 2017-04-16 MED ORDER — METOPROLOL TARTRATE 25 MG PO TABS: 25.0000 mg | ORAL_TABLET | Freq: Two times a day (BID) | ORAL | 3 refills | Status: DC

## 2017-04-16 NOTE — Patient Instructions (Signed)

## 2017-04-16 NOTE — Addendum Note (Signed)
Addended by: Julio SicksBOWERS, Thamas Appleyard L on: 04/16/2017 05:28 PM   Modules accepted: Orders

## 2017-04-22 DIAGNOSIS — Z800 Family history of malignant neoplasm of digestive organs: Secondary | ICD-10-CM | POA: Diagnosis not present

## 2017-04-22 DIAGNOSIS — Z1211 Encounter for screening for malignant neoplasm of colon: Secondary | ICD-10-CM | POA: Diagnosis not present

## 2017-04-22 DIAGNOSIS — Z01818 Encounter for other preprocedural examination: Secondary | ICD-10-CM | POA: Diagnosis not present

## 2017-05-08 DIAGNOSIS — Z800 Family history of malignant neoplasm of digestive organs: Secondary | ICD-10-CM | POA: Diagnosis not present

## 2017-05-08 DIAGNOSIS — Z1211 Encounter for screening for malignant neoplasm of colon: Secondary | ICD-10-CM | POA: Diagnosis not present

## 2017-06-24 DIAGNOSIS — E1165 Type 2 diabetes mellitus with hyperglycemia: Secondary | ICD-10-CM | POA: Diagnosis not present

## 2017-10-19 DIAGNOSIS — E1122 Type 2 diabetes mellitus with diabetic chronic kidney disease: Secondary | ICD-10-CM | POA: Diagnosis not present

## 2017-10-19 DIAGNOSIS — N181 Chronic kidney disease, stage 1: Secondary | ICD-10-CM | POA: Diagnosis not present

## 2017-10-19 DIAGNOSIS — G4700 Insomnia, unspecified: Secondary | ICD-10-CM | POA: Diagnosis not present

## 2018-01-15 DIAGNOSIS — F329 Major depressive disorder, single episode, unspecified: Secondary | ICD-10-CM | POA: Diagnosis not present

## 2018-01-15 DIAGNOSIS — F1010 Alcohol abuse, uncomplicated: Secondary | ICD-10-CM | POA: Diagnosis not present

## 2018-03-09 DIAGNOSIS — R05 Cough: Secondary | ICD-10-CM | POA: Diagnosis not present

## 2018-04-01 ENCOUNTER — Other Ambulatory Visit: Payer: Self-pay | Admitting: Interventional Cardiology

## 2018-04-01 DIAGNOSIS — I2510 Atherosclerotic heart disease of native coronary artery without angina pectoris: Secondary | ICD-10-CM

## 2018-04-20 NOTE — Progress Notes (Signed)
Cardiology Office Note:    Date:  04/21/2018   ID:  Maurice Johnston, DOB 29-Apr-1960, MRN [id number]  PCP:  [name], MD  Cardiologist:  Lesleigh Noe, MD   Referring MD: [name], MD   Chief Complaint  Patient presents with  . Coronary Artery Disease    History of Present Illness:    Maurice Johnston is a 58 y.o. male with a hx of history of obesitys/pbariatric surgery,dietcontrolled diabetes,3V CADtreated with culprit lesion PCI/DES toLCx (08/2012),HTN, HLDand obesity.  He denies angina.  He also has not experienced any exertion related dyspnea.  He has not been as diligent with exercise as previous.  Work has limited physical activity.  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  He does have to drive all over the state.  Right lower extremity ankle and calf discomfort.  This occurs at various times.  Exam demonstrates decreased pulse in the left foot.  Pulse in the right foot is faint.  No medication side effects.  Maintaining weight loss after bariatric surgery.  Is not had transient neurological symptoms nor has he had palpitations/rapid heart rate.   Past Medical History:  Diagnosis Date  . Diabetes mellitus without complication (HCC)   . Family history of adverse reaction to anesthesia    mother- nausea and slow to wake up   . GERD (gastroesophageal reflux disease)   . History of coronary artery stent placement   . Hypercholesterolemia   . Hyperlipidemia LDL goal < 70   . Hypertension   . Morbid obesity (HCC)   . Pleuritic chest pain    Left lower chest / RUQ pain worse with laying flat - ?Musculoskeletal versus pleurisy/pericarditis vs other   . Splenomegaly    Maybe associated with thrombocytopenia   . Thrombocytopenia (HCC)   . Unstable angina pectoris (HCC)    Onset 08/28/2012. Severe multivessel diabetic diffuse 3VCAD     Past Surgical History:  Procedure  Laterality Date  . BREATH TEK H PYLORI N/A 12/12/2013   Procedure: BREATH TEK H PYLORI;  Surgeon: Glenna Fellows, MD;  Location: [facility];  Service: General;  Laterality: N/A;  . CAROTID STENT    . CHOLECYSTECTOMY N/A 01/30/2014   Procedure: LAPAROSCOPIC CHOLECYSTECTOMY WITH INTRAOPERATIVE CHOLANGIOGRAM;  Surgeon: Glenna Fellows, MD;  Location: [facility];  Service: General;  Laterality: N/A;  . GASTRIC ROUX-EN-Y N/A 01/30/2014   Procedure: LAPAROSCOPIC ROUX-EN-Y GASTRIC BYPASS WITH UPPER ENDOSCOPY;  Surgeon: Glenna Fellows, MD;  Location: [facility];  Service: General;  Laterality: N/A;  . LEFT HEART CATHETERIZATION WITH CORONARY ANGIOGRAM N/A 09/02/2012   Procedure: LEFT HEART CATHETERIZATION WITH CORONARY ANGIOGRAM;  Surgeon: Lesleigh Noe, MD;  Location: [facility] CATH LAB;  Service: Cardiovascular;  Laterality: N/A;  . PERCUTANEOUS CORONARY STENT INTERVENTION (PCI-S) N/A 09/06/2012   Procedure: PERCUTANEOUS CORONARY STENT INTERVENTION (PCI-S);  Surgeon: Lesleigh Noe, MD;  Location: [facility] CATH LAB;  Service: Cardiovascular;  Laterality: N/A;  . VASECTOMY      Current Medications: Current Meds  Medication Sig  . aspirin EC 81 MG tablet Take 81 mg by mouth at bedtime.   Marland Kitchen atorvastatin (LIPITOR) 10 MG tablet Take 1 tablet (10 mg total) by mouth daily. Please keep upcoming appt in February with Dr. Katrinka Blazing for future refills. Thank you  . escitalopram (LEXAPRO) 10 MG tablet Take 10 mg by mouth daily.  . metFORMIN (GLUCOPHAGE) 500 MG tablet  Take 500 mg by mouth 2 (two) times daily.  . metoprolol tartrate (LOPRESSOR) 25 MG tablet Take 1 tablet (25 mg total) by mouth 2 (two) times daily.  . Multiple Vitamin (MULTIVITAMIN WITH MINERALS) TABS tablet Take 1 tablet by mouth every morning.  . temazepam (RESTORIL) 30 MG capsule Take 30 mg by mouth at bedtime as needed for sleep.   Marland Kitchen VIAGRA 100 MG tablet Take 50 mg by mouth daily as needed. (erectile dysfunction)  . [DISCONTINUED] metoprolol tartrate  (LOPRESSOR) 25 MG tablet Take 1 tablet (25 mg total) by mouth 2 (two) times daily.     Allergies:   Patient has no known allergies.   Social History   Socioeconomic History  . Marital status: Married    Spouse name: Not on file  . Number of children: Not on file  . Years of education: Not on file  . Highest education level: Not on file  Occupational History  . Not on file  Social Needs  . Financial resource strain: Not on file  . Food insecurity:    Worry: Not on file    Inability: Not on file  . Transportation needs:    Medical: Not on file    Non-medical: Not on file  Tobacco Use  . Smoking status: Never Smoker  . Smokeless tobacco: Never Used  Substance and Sexual Activity  . Alcohol use: Yes    Comment: occasional  . Drug use: No  . Sexual activity: Not on file  Lifestyle  . Physical activity:    Days per week: Not on file    Minutes per session: Not on file  . Stress: Not on file  Relationships  . Social connections:    Talks on phone: Not on file    Gets together: Not on file    Attends religious service: Not on file    Active member of club or organization: Not on file    Attends meetings of clubs or organizations: Not on file    Relationship status: Not on file  Other Topics Concern  . Not on file  Social History Narrative  . Not on file     Family History: The patient's family history includes Alcoholism in his father; Cancer in his mother; Cancer - Colon in his father; Diabetes Mellitus II in his mother; Diverticulitis in his mother; Heart attack in his paternal grandfather; Heart disease in his mother; Hypertension in his mother; Kidney disease in his mother.  ROS:   Please see the history of present illness.    Intermittent leg pain involving his right ankle.  All other systems reviewed and are negative.  EKGs/Labs/Other Studies Reviewed:    The following studies were reviewed today: No recent vascular studies  2014 carotid vascular study  demonstrated less than 39% stenosis bilaterally.  2D Doppler echocardiogram 2014: Study Conclusions  - Left ventricle: The cavity size was normal. Wall thickness was increased in a pattern of mild LVH. Systolic function was normal. The estimated ejection fraction was in the range of 60% to 65%. Wall motion was normal; there were no regional wall motion abnormalities. Left ventricular diastolic function parameters were normal. - Right ventricle: The cavity size was mildly dilated.  EKG:  EKG sinus rhythm with normal appearance.  No change when compared to prior.  Recent Labs: No results found for requested labs within last 8760 hours.  Recent Lipid Panel    Component Value Date/Time   CHOL 115 10/28/2013 1456   TRIG 100 10/28/2013  1456   HDL 34 (L) 10/28/2013 1456   CHOLHDL 3.4 10/28/2013 1456   VLDL 20 10/28/2013 1456   LDLCALC 61 10/28/2013 1456    Physical Exam:    VS:  BP 124/76   Pulse 63   Ht 5\' 11"  (1.803 m)   Wt 210 lb 9.6 oz (95.5 kg)   SpO2 96%   BMI 29.37 kg/m     Wt Readings from Last 3 Encounters:  04/21/18 210 lb 9.6 oz (95.5 kg)  04/16/17 207 lb 6.4 oz (94.1 kg)  12/28/15 192 lb 6.4 oz (87.3 kg)     GEN: Obese.. No acute distress HEENT: Normal NECK: No JVD. LYMPHATICS: No lymphadenopathy CARDIAC: RRR.  No murmur, S4 gallop, no edema VASCULAR: Absent pedal pulse left foot and reduced pulse in right ankle pulses, no bruits RESPIRATORY:  Clear to auscultation without rales, wheezing or rhonchi  ABDOMEN: Soft, non-tender, non-distended, No pulsatile mass, MUSCULOSKELETAL: No deformity  SKIN: Warm and dry NEUROLOGIC:  Alert and oriented x 3 PSYCHIATRIC:  Normal affect   ASSESSMENT:    1. Atherosclerosis of native coronary artery of native heart with other form of angina pectoris (HCC)   2. 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 Essential  hypertension   4. Hyperlipidemia with target LDL less than 70   5. Morbid obesity (HCC)   6. Right leg pain    PLAN:    In order of problems listed above:  1. Stable from CV standpoint.  Not been as active as I would like.  We discussed aggressive secondary risk modification including intentional 150 minutes or greater moderate physical activity per week.  We also discussed the importance of better diabetes control.  Last A1c was 7.  He should certainly be a candidate for SGLT2 therapy to decrease cardiovascular risk. 2. Consider adding SGLT2 or GLP-1 agonist therapy. 3. Target blood pressure is being achieved.  Less than 130/80 mmHg.  We did discuss low-salt diet. 4. Target LDL should be less than 70.  If triglycerides remain mildly elevated there would also be protective benefits of adding icosapent ethyl 2 g twice daily. 5. Exercise, decrease caloric intake, discussed in detail 6. With diminished pulses in the feet and complaint of right lower extremity pain although not classical for claudication, will plan to perform bilateral lower extremity Doppler study.  Overall education and awareness concerning primary/secondary risk prevention was discussed in detail: LDL less than 70, hemoglobin A1c less than 7, blood pressure target less than 130/80 mmHg, >150 minutes of moderate aerobic activity per week, avoidance of smoking, weight control (via diet and exercise), and continued surveillance/management of/for obstructive sleep apnea.  In addition to targeting a hemoglobin A1c less than 7 by conventional means, additional therapies to decrease CV risk should be considered.  SGLT2 inhibitor therapy ("-flozin" e.g. Invokana) decreases risk of heart failure development.  GLP-1 agonist therapy ("-glutide" agents e.g. Trulicity; or "-atide" agents e.g.Byetta) to reduce vascular events.  1 year follow-up  Medication Adjustments/Labs and Tests Ordered: Current medicines are reviewed at length with the  patient today.  Concerns regarding medicines are outlined above.  Orders Placed This Encounter  Procedures  . EKG 12-Lead   Meds ordered this encounter  Medications  . metoprolol tartrate (LOPRESSOR) 25 MG tablet    Sig: Take 1 tablet (25 mg total) by mouth 2 (two) times daily.    Dispense:  180 tablet  Refill:  3    Patient Instructions  Medication Instructions:  Your physician recommends that you continue on your current medications as directed. Please refer to the Current Medication list given to you today.  If you need a refill on your cardiac medications before your next appointment, please call your pharmacy.   Lab work: None If you have labs (blood work) drawn today and your tests are completely normal, you will receive your results only by: Marland Kitchen. MyChart Message (if you have MyChart) OR . A paper copy in the mail If you have any lab test that is abnormal or we need to change your treatment, we will call you to review the results.  Testing/Procedures: Your physician has requested that you have a lower or upper extremity arterial duplex. This test is an ultrasound of the arteries in the legs or arms. It looks at arterial blood flow in the legs and arms. Allow one hour for Lower and Upper Arterial scans. There are no restrictions or special instructions   Follow-Up: At [facility], you and your health needs are our priority.  As part of our continuing mission to provide you with exceptional heart care, we have created designated Provider Care Teams.  These Care Teams include your primary Cardiologist (physician) and Advanced Practice Providers (APPs -  Physician Assistants and Nurse Practitioners) who all work together to provide you with the care you need, when you need it. You will need a follow up appointment in 12 months.  Please call our office 2 months in advance to schedule this appointment.  You may see Lesleigh NoeHenry W Smith [name], MD or one of the following Advanced Practice  Providers on your designated Care Team:   Norma FredricksonLori Gerhardt, NP Nada BoozerLaura Ingold, NP . Georgie ChardJill McDaniel, NP  Any Other Special Instructions Will Be Listed Below (If Applicable).  Your provider recommends that you maintain 150 minutes per week of moderate aerobic activity.       Signed, Lesleigh NoeHenry W Smith [name], MD  04/21/2018 12:08 PM    [facility]

## 2018-04-21 ENCOUNTER — Ambulatory Visit: Payer: BLUE CROSS/BLUE SHIELD | Admitting: Interventional Cardiology

## 2018-04-21 ENCOUNTER — Encounter: Payer: Self-pay | Admitting: Interventional Cardiology

## 2018-04-21 VITALS — BP 124/76 | HR 63 | Ht 71.0 in | Wt 210.6 lb

## 2018-04-21 DIAGNOSIS — Z794 Long term (current) use of insulin: Secondary | ICD-10-CM

## 2018-04-21 DIAGNOSIS — E785 Hyperlipidemia, unspecified: Secondary | ICD-10-CM | POA: Diagnosis not present

## 2018-04-21 DIAGNOSIS — E0822 Diabetes mellitus due to underlying condition with diabetic chronic kidney disease: Secondary | ICD-10-CM

## 2018-04-21 DIAGNOSIS — M79604 Pain in right leg: Secondary | ICD-10-CM

## 2018-04-21 DIAGNOSIS — I25118 Atherosclerotic heart disease of native coronary artery with other forms of angina pectoris: Secondary | ICD-10-CM | POA: Diagnosis not present

## 2018-04-21 DIAGNOSIS — I10 Essential (primary) hypertension: Secondary | ICD-10-CM | POA: Diagnosis not present

## 2018-04-21 DIAGNOSIS — N182 Chronic kidney disease, stage 2 (mild): Secondary | ICD-10-CM

## 2018-04-21 MED ORDER — METOPROLOL TARTRATE 25 MG PO TABS: 25.0000 mg | ORAL_TABLET | Freq: Two times a day (BID) | ORAL | 3 refills | Status: DC

## 2018-04-21 NOTE — Patient Instructions (Signed)
Medication Instructions:  Your physician recommends that you continue on your current medications as directed. Please refer to the Current Medication list given to you today.  If you need a refill on your cardiac medications before your next appointment, please call your pharmacy.   Lab work: None If you have labs (blood work) drawn today and your tests are completely normal, you will receive your results only by: Marland Kitchen MyChart Message (if you have MyChart) OR . A paper copy in the mail If you have any lab test that is abnormal or we need to change your treatment, we will call you to review the results.  Testing/Procedures: Your physician has requested that you have a lower or upper extremity arterial duplex. This test is an ultrasound of the arteries in the legs or arms. It looks at arterial blood flow in the legs and arms. Allow one hour for Lower and Upper Arterial scans. There are no restrictions or special instructions   Follow-Up: At [facility], you and your health needs are our priority.  As part of our continuing mission to provide you with exceptional heart care, we have created designated Provider Care Teams.  These Care Teams include your primary Cardiologist (physician) and Advanced Practice Providers (APPs -  Physician Assistants and Nurse Practitioners) who all work together to provide you with the care you need, when you need it. You will need a follow up appointment in 12 months.  Please call our office 2 months in advance to schedule this appointment.  You may see Lesleigh Noe, MD or one of the following Advanced Practice Providers on your designated Care Team:   Norma Fredrickson, NP Nada Boozer, NP . Georgie Chard, NP  Any Other Special Instructions Will Be Listed Below (If Applicable).  Your provider recommends that you maintain 150 minutes per week of moderate aerobic activity.

## 2018-04-23 ENCOUNTER — Other Ambulatory Visit: Payer: Self-pay | Admitting: Interventional Cardiology

## 2018-04-23 DIAGNOSIS — I739 Peripheral vascular disease, unspecified: Secondary | ICD-10-CM

## 2018-04-29 DIAGNOSIS — E7800 Pure hypercholesterolemia, unspecified: Secondary | ICD-10-CM | POA: Diagnosis not present

## 2018-04-29 DIAGNOSIS — E1122 Type 2 diabetes mellitus with diabetic chronic kidney disease: Secondary | ICD-10-CM | POA: Diagnosis not present

## 2018-04-29 DIAGNOSIS — Z125 Encounter for screening for malignant neoplasm of prostate: Secondary | ICD-10-CM | POA: Diagnosis not present

## 2018-04-29 DIAGNOSIS — Z0000 Encounter for general adult medical examination without abnormal findings: Secondary | ICD-10-CM | POA: Diagnosis not present

## 2018-04-29 DIAGNOSIS — Z79899 Other long term (current) drug therapy: Secondary | ICD-10-CM | POA: Diagnosis not present

## 2018-04-29 DIAGNOSIS — Z23 Encounter for immunization: Secondary | ICD-10-CM | POA: Diagnosis not present

## 2018-04-29 DIAGNOSIS — R0989 Other specified symptoms and signs involving the circulatory and respiratory systems: Secondary | ICD-10-CM | POA: Diagnosis not present

## 2018-04-29 DIAGNOSIS — M2550 Pain in unspecified joint: Secondary | ICD-10-CM | POA: Diagnosis not present

## 2018-05-04 ENCOUNTER — Other Ambulatory Visit: Payer: Self-pay | Admitting: Interventional Cardiology

## 2018-05-04 ENCOUNTER — Ambulatory Visit (HOSPITAL_COMMUNITY)
Admission: RE | Admit: 2018-05-04 | Discharge: 2018-05-04 | Disposition: A | Discharge status: Home / Self Care | Payer: BLUE CROSS/BLUE SHIELD | Source: Ambulatory Visit | Attending: Cardiovascular Disease | Admitting: Cardiovascular Disease

## 2018-05-04 DIAGNOSIS — I739 Peripheral vascular disease, unspecified: Secondary | ICD-10-CM | POA: Insufficient documentation

## 2018-05-05 ENCOUNTER — Ambulatory Visit
Admission: RE | Admit: 2018-05-05 | Discharge: 2018-05-05 | Disposition: A | Discharge status: Home / Self Care | Payer: BLUE CROSS/BLUE SHIELD | Source: Ambulatory Visit | Attending: Family Medicine | Admitting: Family Medicine

## 2018-05-05 ENCOUNTER — Other Ambulatory Visit: Payer: Self-pay | Admitting: Family Medicine

## 2018-05-05 DIAGNOSIS — I6523 Occlusion and stenosis of bilateral carotid arteries: Secondary | ICD-10-CM | POA: Diagnosis not present

## 2018-05-05 DIAGNOSIS — R0989 Other specified symptoms and signs involving the circulatory and respiratory systems: Secondary | ICD-10-CM

## 2018-06-26 ENCOUNTER — Other Ambulatory Visit: Payer: Self-pay | Admitting: Interventional Cardiology

## 2018-06-26 DIAGNOSIS — I2510 Atherosclerotic heart disease of native coronary artery without angina pectoris: Secondary | ICD-10-CM

## 2018-07-27 DIAGNOSIS — J069 Acute upper respiratory infection, unspecified: Secondary | ICD-10-CM | POA: Diagnosis not present

## 2018-07-27 DIAGNOSIS — R0602 Shortness of breath: Secondary | ICD-10-CM | POA: Diagnosis not present

## 2018-10-14 ENCOUNTER — Telehealth: Payer: Self-pay | Admitting: Interventional Cardiology

## 2018-10-14 DIAGNOSIS — E1165 Type 2 diabetes mellitus with hyperglycemia: Secondary | ICD-10-CM | POA: Diagnosis not present

## 2018-10-14 DIAGNOSIS — M2550 Pain in unspecified joint: Secondary | ICD-10-CM | POA: Diagnosis not present

## 2018-10-14 DIAGNOSIS — N3000 Acute cystitis without hematuria: Secondary | ICD-10-CM | POA: Diagnosis not present

## 2018-10-14 DIAGNOSIS — Z23 Encounter for immunization: Secondary | ICD-10-CM | POA: Diagnosis not present

## 2018-10-14 NOTE — Telephone Encounter (Signed)
New message   Pt c/o of Chest Pain: STAT if CP now or developed within 24 hours  1. Are you having CP right now? No   2. Are you experiencing any other symptoms (ex. SOB, nausea, vomiting, sweating)? No   3. How long have you been experiencing CP? About a month, patient states that it is chest tightness  4. Is your CP continuous or coming and going?comes and goes   5. Have you taken Nitroglycerin? No  ?

## 2018-10-14 NOTE — Telephone Encounter (Signed)
Pt states that he has been having intermittent chest tightness for about a month now, possibly longer.  Has not been severe enough to take Nitro.  Denies SOB, lightheadedness, dizziness or actual pain, just tightness.  Seems to only occur when he is exerting or stressed.  Amount of time that episodes last varies.  BP at PCP today was 138/68.  Scheduled pt to see Doreene Adas, PA-C tomorrow for evaluation.  Screened for COVID virus.  Pt verbalized understanding and was in agreement with plan.      COVID-19 Pre-Screening Questions:  . In the past 7 to 10 days have you had a cough,  shortness of breath, headache, congestion, fever (100 or greater) body aches, chills, sore throat, or sudden loss of taste or sense of smell? No . Have you been around anyone with known Covid 19? No . Have you been around anyone who is awaiting Covid 19 test results in the past 7 to 10 days? No . Have you been around anyone who has been exposed to Covid 19, or has mentioned symptoms of Covid 19 within the past 7 to 10 days? No  If you have any concerns/questions about symptoms patients report during screening (either on the phone or at threshold). Contact the provider seeing the patient or DOD for further guidance.  If neither are available contact a member of the leadership team.

## 2018-10-14 NOTE — Progress Notes (Signed)
Cardiology Office Note:    Date:  10/15/2018   ID:  Maurice Johnston, DOB 10/20/1960, MRN [id number]  PCP:  Mila PalmerWolters, Sharon, MD  Cardiologist:  Lesleigh NoeHenry W Smith [name], MD   Referring MD: Mila PalmerWolters, Sharon, MD   Chief Complaint  Patient presents with  . Chest Pain    History of Present Illness:    Maurice Johnston is a 58 y.o. male with a hx of hypertension, DM, CKD associated with DM and obesity, splenomegaly, hyperlipidemia, and morbid obesity status post bariatric surgery.  He has three-vessel disease treated with DES to left circumflex 08/2012.  He was last seen in clinic on 04/21/2018 with Dr. Katrinka BlazingSmith and was doing well at that time.  He returns today for follow-up. However, he does complain of chest pain that started 2 months ago. CP occurs when he is stressed or with exertion. CP generally lasts an hour and rated 2-3/10 and described as a pressure like someone sitting on him. No SOB, but intermittent dizziness not necessarily associated with CP. He denies nausea and diaphoresis.  CP is somewhat similar to his CP in 2014 prior to his stents. He saw his PCP for follow up and mentioned the CP and was referred back to cardiology. CP seems to be progressing, although he states he has gotten used to the CP and tries to ignore it and continues working. CP sometimes occurs at rest, but he has been working nonstop every day since June 23. He has not taken any nitro, doesn't have a current prescription.   He does carry a diagnosis of CKD due to obesity and diabetes; however, renal function has been controlled since bariatric surgery for the last four years, per the patient. The last sCr that I can see in Epic was in 04/2018 and was 0.74. Rechecked at PCP office yesterday and sCr was 0.87.  He is also having bilateral leg pain that sounds like claudication. I reviewed his ABIs in 04/2018 and was read as no significant blockages.   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 He has been tested twice for COVID because of positive cases of employees in the same building - has been negative so far.   Past Medical History:  Diagnosis Date  . Diabetes mellitus without complication (HCC)   . Family history of adverse reaction to anesthesia    mother- nausea and slow to wake up   . GERD (gastroesophageal reflux disease)   . History of coronary artery stent placement   . Hypercholesterolemia   . Hyperlipidemia LDL goal < 70   . Hypertension   . Morbid obesity (HCC)   . Pleuritic chest pain    Left lower chest / RUQ pain worse with laying flat - ?Musculoskeletal versus pleurisy/pericarditis vs other   . Splenomegaly    Maybe associated with thrombocytopenia   . Thrombocytopenia (HCC)   . Unstable angina pectoris (HCC)    Onset 08/28/2012. Severe multivessel diabetic diffuse 3VCAD     Past Surgical History:  Procedure Laterality Date  . BREATH TEK H PYLORI N/A 12/12/2013   Procedure: BREATH TEK H PYLORI;  Surgeon: Glenna FellowsBenjamin Hoxworth, MD;  [facility];  Service: General;  Laterality: N/A;  . CAROTID STENT    . CHOLECYSTECTOMY N/A 01/30/2014   Procedure: LAPAROSCOPIC CHOLECYSTECTOMY WITH INTRAOPERATIVE CHOLANGIOGRAM;  Surgeon: Glenna FellowsBenjamin Hoxworth, MD;  Location: [facility];  Service: General;  Laterality: N/A;  . GASTRIC ROUX-EN-Y N/A 01/30/2014   Procedure: LAPAROSCOPIC ROUX-EN-Y GASTRIC  BYPASS WITH UPPER ENDOSCOPY;  Surgeon: Glenna FellowsBenjamin Hoxworth, MD;  Location: [facility];  Service: General;  Laterality: N/A;  . LEFT HEART CATHETERIZATION WITH CORONARY ANGIOGRAM N/A 09/02/2012   Procedure: LEFT HEART CATHETERIZATION WITH CORONARY ANGIOGRAM;  Surgeon: Lesleigh NoeHenry W Smith [name], MD;  [facility];  Service: Cardiovascular;  Laterality: N/A;  . PERCUTANEOUS CORONARY STENT INTERVENTION (PCI-S) N/A 09/06/2012   Procedure: PERCUTANEOUS CORONARY STENT INTERVENTION (PCI-S);  Surgeon: Lesleigh NoeHenry W Smith [name], MD;  [facility];  Service: Cardiovascular;   Laterality: N/A;  . VASECTOMY      Current Medications: Current Meds  Medication Sig  . aspirin EC 81 MG tablet Take 81 mg by mouth at bedtime.   Marland Kitchen. atorvastatin (LIPITOR) 10 MG tablet TAKE 1 TABLET (10 MG TOTAL) BY MOUTH DAILY. PLEASE KEEP UPCOMING APPT IN FEBRUARY WITH DR. Katrinka BlazingSMITH FOR FUTURE REFILLS. THANK YOU  . escitalopram (LEXAPRO) 10 MG tablet Take 10 mg by mouth daily.  . Insulin Degludec-Liraglutide (XULTOPHY) 100-3.6 UNIT-MG/ML SOPN Inject into the skin.  . metFORMIN (GLUCOPHAGE) 500 MG tablet Take 500 mg by mouth 2 (two) times daily.  . metoprolol tartrate (LOPRESSOR) 25 MG tablet Take 1 tablet (25 mg total) by mouth 2 (two) times daily.  . Multiple Vitamin (MULTIVITAMIN WITH MINERALS) TABS tablet Take 1 tablet by mouth every morning.  . temazepam (RESTORIL) 30 MG capsule Take 30 mg by mouth at bedtime as needed for sleep.   Marland Kitchen. VIAGRA 100 MG tablet Take 50 mg by mouth daily as needed. (erectile dysfunction)     Allergies:   Patient has no known allergies.   Social History   Socioeconomic History  . Marital status: Married    Spouse name: Not on file  . Number of children: Not on file  . Years of education: Not on file  . Highest education level: Not on file  Occupational History  . Not on file  Social Needs  . Financial resource strain: Not on file  . Food insecurity    Worry: Not on file    Inability: Not on file  . Transportation needs    Medical: Not on file    Non-medical: Not on file  Tobacco Use  . Smoking status: Never Smoker  . Smokeless tobacco: Never Used  Substance and Sexual Activity  . Alcohol use: Yes    Comment: occasional  . Drug use: No  . Sexual activity: Not on file  Lifestyle  . Physical activity    Days per week: Not on file    Minutes per session: Not on file  . Stress: Not on file  Relationships  . Social Musicianconnections    Talks on phone: Not on file    Gets together: Not on file    Attends religious service: Not on file    Active  member of club or organization: Not on file    Attends meetings of clubs or organizations: Not on file    Relationship status: Not on file  Other Topics Concern  . Not on file  Social History Narrative  . Not on file     Family History: The patient's family history includes Alcoholism in his father; Cancer in his mother; Cancer - Colon in his father; Diabetes Mellitus II in his mother; Diverticulitis in his mother; Heart attack in his paternal grandfather; Heart disease in his mother; Hypertension in his mother; Kidney disease in his mother.  ROS:   Please see the history of present illness.  All other systems reviewed and are negative.  EKGs/Labs/Other Studies Reviewed:    The following studies were reviewed today:  ABI 05/04/18: Summary: Right: Resting right ankle-brachial index indicates noncompressible right lower extremity arteries.The right toe-brachial index is normal.  Left: Resting left ankle-brachial index indicates noncompressible left lower extremity arteries.The left toe-brachial index is normal.  No significant blockages.   Heart cath 09/02/12 The left anterior descending artery is heavily calcified proximally. Luminal irregularities with up to 30% narrowing is noted. The first diagonal is small to moderate in size and 99% (not graftable) the second diagonal arising from the mid LAD is moderate to large in size or diagonal and contains 80-90% ostial narrowing beyond the second diagonal the LAD becomes diffusely diseased with up to 70-80% stenosis and a segmental region that overlaps the third diagonal. The diagonal is small. The LAD beyond the third diagonal is diffusely diseased although no high-grade focal lesion until the apical LAD where 90% stenosis is noted there is heard minimal transapical LAD distribution..  The left circumflex artery is the culprit vessel. The entire mid vessel contains severe disease with tandem stenoses of 70%, 99%, and 70% before the  origin of the second and third obtuse marginal branches. This entire segment could be treated with a long drug-eluting stent and were probably significantly improve anginal symptoms.  The right coronary artery is dominant. Moderate irregularities are noted in the proximal and mid segment. A large PDA is 95% obstructed. The bifurcation of the distal right coronary into the continuation of the RCA beyond the PDA contains 70% stenosis (forming a [place] 0, 1, 1 bifurcation). The PDA/RCA continuation could be treated with PCI although bifurcation PCI is associated with a higher risk of ischemic complications and restenosis.  Due to flank pain, patient unable to lie flat and intervention was rescheduled.  Heart cath 09/06/12: Successful stenting of the MID circumflex from 99% to 0% with TIMI grade 3 flow to   EKG:  EKG is ordered today.  The ekg ordered today demonstrates sinus bradycardia with HR 58, no signs of ischemia  Recent Labs: No results found for requested labs within last 8760 hours.  Recent Lipid Panel    Component Value Date/Time   CHOL 115 10/28/2013 1456   TRIG 100 10/28/2013 1456   HDL 34 (L) 10/28/2013 1456   CHOLHDL 3.4 10/28/2013 1456   VLDL 20 10/28/2013 1456   LDLCALC 61 10/28/2013 1456    Physical Exam:    VS:  BP 118/80   Pulse 61   Temp (!) 97.3 F (36.3 C)   Ht 5\' 11"  (1.803 m)   Wt 201 lb (91.2 kg)   SpO2 97%   BMI 28.03 kg/m     Wt Readings from Last 3 Encounters:  10/15/18 201 lb (91.2 kg)  04/21/18 210 lb 9.6 oz (95.5 kg)  04/16/17 207 lb 6.4 oz (94.1 kg)     GEN:  Well nourished, well developed in no acute distress HEENT: Normal NECK: No JVD; No carotid bruits LYMPHATICS: No lymphadenopathy CARDIAC: RRR, no murmurs, rubs, gallops RESPIRATORY:  Clear to auscultation without rales, wheezing or rhonchi  ABDOMEN: Soft, non-tender, non-distended MUSCULOSKELETAL:  No edema; No deformity  SKIN: Warm and dry NEUROLOGIC:  Alert and oriented x 3  PSYCHIATRIC:  Normal affect   ASSESSMENT:    1. Coronary artery disease involving native coronary artery of native heart with unstable angina pectoris (HCC)   2. Essential hypertension   3. Diabetes mellitus due to underlying  condition with stage 2 chronic kidney disease, with long-term current use of insulin ([place])   4. Morbid obesity ([place])   5. Pain in both lower extremities    PLAN:    In order of problems listed above:  Coronary artery disease involving native coronary artery of native heart with unstable angina pectoris ([place])  Ongoing chest pain concerning for angina He describes chest pain that is concerning for angina. I reviewed his cath reports from 2014 with Dr. Harrell Gave (DOD) who suggested trying medical therapy first. I will start him on a low dose of imdur - 15 mg and try to titrate to 30 mg. If this causes hypotension, or doesn't relieve his chest pain, I will try ranexa. In the meantime, I will have him follow up closely with Dr. Tamala Julian to determine plan for repeat heart catheterization.   History of CKD related to DM and obesity Labs reviewed from PCP drawn yesterday with sCr 0.87 Renal function has improved with weight loss and now that his DM is better-controlled.  Essential hypertension Well controlled. Will monitor with addition of imdur.   Diabetes mellitus due to underlying condition with stage 2 chronic kidney disease, with long-term current use of insulin (HCC) A1c is now 7.5%, which is an improvement.   Morbid obesity ([place])  He has lost significant weight following bariatric surgery.  Hyperlipidemia [facility] report with lipids in 04/2018: Total cholesterol: 150 Triglycerides: 252 LDL; 54 HDL:   45 Continue statin.  Leg pain, Claudication Reviewed last ABIs in 04/2018 - vessels did not collapse preventing accurate assessment. TBI suggests no severe blockage. I am able to palpate distal pulses today.   Medication Adjustments/Labs and Tests Ordered:  Current medicines are reviewed at length with the patient today.  Concerns regarding medicines are outlined above.  Orders Placed This Encounter  Procedures  . EKG 12-Lead   Meds ordered this encounter  Medications  . nitroGLYCERIN (NITROSTAT) 0.4 MG SL tablet    Sig: Place 1 tablet (0.4 mg total) under the tongue every 5 (five) minutes as needed for chest pain.    Dispense:  25 tablet    Refill:  4  . isosorbide mononitrate (IMDUR) 30 MG 24 hr tablet    Sig: Take 1/2 tablet (15 mg) by mouth daily for 1 week; then take 1 tablet (30 mg) daily.    Dispense:  45 tablet    Refill:  1    Signed, Ledora Bottcher, [place]  10/15/2018 9:50 AM    [place]

## 2018-10-15 ENCOUNTER — Ambulatory Visit: Payer: BC Managed Care – PPO | Admitting: Physician Assistant

## 2018-10-15 ENCOUNTER — Telehealth: Payer: Self-pay | Admitting: *Deleted

## 2018-10-15 ENCOUNTER — Encounter: Payer: Self-pay | Admitting: Physician Assistant

## 2018-10-15 ENCOUNTER — Other Ambulatory Visit: Payer: Self-pay

## 2018-10-15 VITALS — BP 118/80 | HR 61 | Temp 97.3°F | Ht 71.0 in | Wt 201.0 lb

## 2018-10-15 DIAGNOSIS — I10 Essential (primary) hypertension: Secondary | ICD-10-CM

## 2018-10-15 DIAGNOSIS — M79605 Pain in left leg: Secondary | ICD-10-CM

## 2018-10-15 DIAGNOSIS — E0822 Diabetes mellitus due to underlying condition with diabetic chronic kidney disease: Secondary | ICD-10-CM | POA: Diagnosis not present

## 2018-10-15 DIAGNOSIS — Z794 Long term (current) use of insulin: Secondary | ICD-10-CM

## 2018-10-15 DIAGNOSIS — N182 Chronic kidney disease, stage 2 (mild): Secondary | ICD-10-CM

## 2018-10-15 DIAGNOSIS — M79604 Pain in right leg: Secondary | ICD-10-CM

## 2018-10-15 DIAGNOSIS — I25110 Atherosclerotic heart disease of native coronary artery with unstable angina pectoris: Secondary | ICD-10-CM

## 2018-10-15 MED ORDER — NITROGLYCERIN 0.4 MG SL SUBL: 0.4000 mg | SUBLINGUAL_TABLET | SUBLINGUAL | 4 refills | Status: AC | PRN

## 2018-10-15 MED ORDER — ISOSORBIDE MONONITRATE ER 30 MG PO TB24: ORAL_TABLET | ORAL | 1 refills | Status: DC

## 2018-10-15 NOTE — Telephone Encounter (Signed)
Patient was seen this morning by Fabian Sharp, PA. She recommended he see Dr. Tamala Julian (only) in 1 month.  Note to Maude Leriche, RN for assistance.

## 2018-10-15 NOTE — Patient Instructions (Signed)
Medication Instructions:  START Isosorbide 30 mg> take 1/2 tablet (15 mg) daily for 1 week; then increase to 1 tablet (30 mg) daily.  If you need a refill on your cardiac medications before your next appointment, please call your pharmacy.    Follow-Up: At [facility], you and your health needs are our priority.  As part of our continuing mission to provide you with exceptional heart care, we have created designated Provider Care Teams.  These Care Teams include your primary Cardiologist (physician) and Advanced Practice Providers (APPs -  Physician Assistants and Nurse Practitioners) who all work together to provide you with the care you need, when you need it. . Please follow-up in 1 month with Dr. Tamala Julian (ONLY).  Any Other Special Instructions Will Be Listed Below (If Applicable). None

## 2018-11-12 NOTE — Progress Notes (Signed)
CARDIOLOGY OFFICE NOTE  Date:  11/17/2018    Maurice Johnston Date of Birth: 1960/06/29 Medical Record #[id number]  PCP:  Jonathon Jordan, MD  Cardiologist:  Tamala Julian  Chief Complaint  Patient presents with  . Follow-up    History of Present Illness: Maurice Johnston is a 58 y.o. male who presents today for a one month check. Seen for Dr. Tamala Julian.   He has a history of CAD, HTN, DM, CKD associated with DM, obesity, splenomegaly, hyperlipidemia, and morbid obesity status post bariatric surgery.  He has three-vessel CAD disease treated with DES to left circumflex 08/2012.    Last seen by Dr. Tamala Julian in February of 2020 and was doing ok.   Seen as a work in by [employer], [place] back at the end of July with chest pain for 2 months. Somewhat similar to prior chest pain syndrome. Medical therapy was implemented.   The patient does not have symptoms concerning for COVID-19 infection (fever, chills, cough, or new shortness of breath).   Comes in today. Here alone. He feels like he is now doing better. He has not had any more chest discomfort since a day or so after starting the Imdur. No headache. No sl NTG use. He hiked 7 or 8 miles this past weekend - did fine. He is off his Viagra and understands this is not an option. He is trying to get back to a regular exercise routine - got off track with COVID. He is happy with how he is now doing.    Past Medical History:  Diagnosis Date  . Diabetes mellitus without complication ([place])   . Family history of adverse reaction to anesthesia    mother- nausea and slow to wake up   . GERD (gastroesophageal reflux disease)   . History of coronary artery stent placement   . Hypercholesterolemia   . Hyperlipidemia LDL goal < 70   . Hypertension   . Morbid obesity ([place])   . Pleuritic chest pain    Left lower chest / RUQ pain worse with laying flat - ?Musculoskeletal versus pleurisy/pericarditis vs other   . Splenomegaly    Maybe associated with  thrombocytopenia   . Thrombocytopenia ([place])   . Unstable angina pectoris ([place])    Onset 08/28/2012. Severe multivessel diabetic diffuse 3VCAD     Past Surgical History:  Procedure Laterality Date  . BREATH TEK H PYLORI N/A 12/12/2013   Procedure: BREATH TEK H PYLORI;  Surgeon: Excell Seltzer, MD;  Location: [facility];  Service: General;  Laterality: N/A;  . CAROTID STENT    . CHOLECYSTECTOMY N/A 01/30/2014   Procedure: LAPAROSCOPIC CHOLECYSTECTOMY WITH INTRAOPERATIVE CHOLANGIOGRAM;  Surgeon: Excell Seltzer, MD;  Location: [facility];  Service: General;  Laterality: N/A;  . GASTRIC ROUX-EN-Y N/A 01/30/2014   Procedure: LAPAROSCOPIC ROUX-EN-Y GASTRIC BYPASS WITH UPPER ENDOSCOPY;  Surgeon: Excell Seltzer, MD;  Location: [facility];  Service: General;  Laterality: N/A;  . LEFT HEART CATHETERIZATION WITH CORONARY ANGIOGRAM N/A 09/02/2012   Procedure: LEFT HEART CATHETERIZATION WITH CORONARY ANGIOGRAM;  Surgeon: Sinclair Grooms, MD;  Location: [facility] CATH LAB;  Service: Cardiovascular;  Laterality: N/A;  . PERCUTANEOUS CORONARY STENT INTERVENTION (PCI-S) N/A 09/06/2012   Procedure: PERCUTANEOUS CORONARY STENT INTERVENTION (PCI-S);  Surgeon: Sinclair Grooms, MD;  Location[facility];  Service: Cardiovascular;  Laterality: N/A;  . VASECTOMY       Medications: Current Meds  Medication Sig  . aspirin EC 81 MG tablet Take 81 mg by  mouth at bedtime.   Marland Kitchen atorvastatin (LIPITOR) 10 MG tablet TAKE 1 TABLET (10 MG TOTAL) BY MOUTH DAILY. PLEASE KEEP UPCOMING APPT IN FEBRUARY WITH DR. Katrinka Blazing FOR FUTURE REFILLS. THANK YOU  . escitalopram (LEXAPRO) 10 MG tablet Take 10 mg by mouth daily.  . Insulin Degludec-Liraglutide (XULTOPHY) 100-3.6 UNIT-MG/ML SOPN Inject 10 Units into the skin daily.   . isosorbide mononitrate (IMDUR) 30 MG 24 hr tablet Take 1/2 tablet (15 mg) by mouth daily for 1 week; then take 1 tablet (30 mg) daily.  . metFORMIN (GLUCOPHAGE) 500 MG tablet Take 500 mg by mouth 2 (two) times  daily.  . metoprolol tartrate (LOPRESSOR) 25 MG tablet Take 1 tablet (25 mg total) by mouth 2 (two) times daily.  . Multiple Vitamin (MULTIVITAMIN WITH MINERALS) TABS tablet Take 1 tablet by mouth every morning.  . nitroGLYCERIN (NITROSTAT) 0.4 MG SL tablet Place 1 tablet (0.4 mg total) under the tongue every 5 (five) minutes as needed for chest pain.  Marland Kitchen temazepam (RESTORIL) 30 MG capsule Take 30 mg by mouth at bedtime as needed for sleep.      Allergies: No Known Allergies  Social History: The patient  reports that he has never smoked. He has never used smokeless tobacco. He reports current alcohol use. He reports that he does not use drugs.   Family History: The patient's family history includes Alcoholism in his father; Cancer in his mother; Cancer - Colon in his father; Diabetes Mellitus II in his mother; Diverticulitis in his mother; Heart attack in his paternal grandfather; Heart disease in his mother; Hypertension in his mother; Kidney disease in his mother.   Review of Systems: Please see the history of present illness.   All other systems are reviewed and negative.   Physical Exam: VS:  BP 130/78   Pulse 63   Ht 5\' 11"  (1.803 m)   Wt 203 lb 12.8 oz (92.4 kg)   SpO2 96%   BMI 28.42 kg/m  .  BMI Body mass index is 28.42 kg/m.  Wt Readings from Last 3 Encounters:  11/17/18 203 lb 12.8 oz (92.4 kg)  10/15/18 201 lb (91.2 kg)  04/21/18 210 lb 9.6 oz (95.5 kg)    General: Pleasant. Well developed, well nourished and in no acute distress.   HEENT: Normal.  Neck: Supple, no JVD, carotid bruits, or masses noted.  Cardiac: Regular rate and rhythm. No murmurs, rubs, or gallops. No edema.  Respiratory:  Lungs are clear to auscultation bilaterally with normal work of breathing.  GI: Soft and nontender.  MS: No deformity or atrophy. Gait and ROM intact.  Skin: Warm and dry. Color is normal.  Neuro:  Strength and sensation are intact and no gross focal deficits noted.  Psych:  Alert, appropriate and with normal affect.   LABORATORY DATA:  EKG:  EKG is not ordered today.   Lab Results  Component Value Date   WBC 11.0 (H) 02/01/2014   HGB 13.3 02/01/2014   HCT 39.6 02/01/2014   PLT 146 (L) 02/01/2014   GLUCOSE 136 (H) 01/31/2014   CHOL 115 10/28/2013   TRIG 100 10/28/2013   HDL 34 (L) 10/28/2013   LDLCALC 61 10/28/2013   ALT 89 (H) 01/31/2014   AST 54 (H) 01/31/2014   NA 139 01/31/2014   K 4.3 01/31/2014   CL 100 01/31/2014   CREATININE 0.78 01/31/2014   BUN 13 01/31/2014   CO2 23 01/31/2014   TSH 0.795 10/28/2013   INR 0.96 09/05/2012  HGBA1C 7.4 (H) 10/28/2013       BNP (last 3 results) No results for input(s): BNP in the last 8760 hours.  ProBNP (last 3 results) No results for input(s): PROBNP in the last 8760 hours.   Other Studies Reviewed Today:  ABI 05/04/18: Summary: Right: Resting right ankle-brachial index indicates noncompressible right lower extremity arteries.The right toe-brachial index is normal.  Left: Resting left ankle-brachial index indicates noncompressible left lower extremity arteries.The left toe-brachial index is normal.  No significant blockages.   Cardiac Cath 09/02/12 The left anterior descending artery is heavily calcified proximally. Luminal irregularities with up to 30% narrowing is noted. The first diagonal is small to moderate in size and 99% (not graftable) the second diagonal arising from the mid LAD is moderate to large in size or diagonal and contains 80-90% ostial narrowing beyond the second diagonal the LAD becomes diffusely diseased with up to 70-80% stenosis and a segmental region that overlaps the third diagonal. The diagonal is small. The LAD beyond the third diagonal is diffusely diseased although no high-grade focal lesion until the apical LAD where 90% stenosis is noted there is heard minimal transapical LAD distribution..  The left circumflex artery is the culprit vessel. The entire mid  vessel contains severe disease with tandem stenoses of 70%, 99%, and 70% before the origin of the second and third obtuse marginal branches. This entire segment could be treated with a long drug-eluting stent and were probably significantly improve anginal symptoms.  The right coronary artery is dominant. Moderate irregularities are noted in the proximal and mid segment. A large PDA is 95% obstructed. The bifurcation of the distal right coronary into the continuation of the RCA beyond the PDA contains 70% stenosis (forming a Medina 0, 1, 1 bifurcation). The PDA/RCA continuation could be treated with PCI although bifurcation PCI is associated with a higher risk of ischemic complications and restenosis.  Due to flank pain, patient unable to lie flat and intervention was rescheduled.  Heart cath 09/06/12: Successfulstenting of the MID circumflex from 99% to 0% with TIMI grade 3 flow to   Assessment/Plan:  1. Chest pain with known CAD - started on medical therapy - symptoms are now resolved. Discussed with Dr. Katrinka BlazingSmith - we will continue with medical therapy along with aggressive CV risk factor modification. LDL is at goal. He is getting back to exercise. He hiked a significant distance this past weekend without issue. He is to call us if symptoms return.  He does have sl NTG on hand if needed.   2. Remote PCI of the mid LCX - has known residual disease - see #1.   3. CKD - most recent creatinine is normal.   4. DM - per PCP   5. Obesity - prior bariatric surgery - has lost about 100# - getting back on track to get to his goal to be under 200#.   6. HTN - BP is fine. No changes made today.   7. HLD - on statin - LDL of 54 noted from labs from February 2020   8. COVID-19 Education: The signs and symptoms of COVID-19 were discussed with the patient and how to seek care for testing (follow up with PCP or arrange E-visit).  The importance of social distancing, staying at home, hand hygiene and  wearing a mask when out in public were discussed today.  Current medicines are reviewed with the patient today.  The patient does not have concerns regarding medicines other than what has been  noted above.  The following changes have been made:  See above.  Labs/ tests ordered today include:   No orders of the defined types were placed in this encounter.    Disposition:   FU with Dr. Katrinka BlazingSmith in a few months. I am happy to see back as needed.   Patient is agreeable to this plan and will call if any problems develop in the interim.   SignedNorma Fredrickson: Viridiana Spaid, NP  11/17/2018 9:59 AM  [facility] [address] [place], [place]  [address] Phone: [phone] Fax: [phone]

## 2018-11-17 ENCOUNTER — Encounter: Payer: Self-pay | Admitting: Nurse Practitioner

## 2018-11-17 ENCOUNTER — Ambulatory Visit: Payer: BC Managed Care – PPO | Admitting: Nurse Practitioner

## 2018-11-17 ENCOUNTER — Other Ambulatory Visit: Payer: Self-pay

## 2018-11-17 VITALS — BP 130/78 | HR 63 | Ht 71.0 in | Wt 203.8 lb

## 2018-11-17 DIAGNOSIS — Z7189 Other specified counseling: Secondary | ICD-10-CM

## 2018-11-17 DIAGNOSIS — I10 Essential (primary) hypertension: Secondary | ICD-10-CM

## 2018-11-17 DIAGNOSIS — I25110 Atherosclerotic heart disease of native coronary artery with unstable angina pectoris: Secondary | ICD-10-CM

## 2018-11-17 DIAGNOSIS — I1310 Hypertensive heart and chronic kidney disease without heart failure, with stage 1 through stage 4 chronic kidney disease, or unspecified chronic kidney disease: Secondary | ICD-10-CM

## 2018-11-17 DIAGNOSIS — E785 Hyperlipidemia, unspecified: Secondary | ICD-10-CM

## 2018-11-17 DIAGNOSIS — Z794 Long term (current) use of insulin: Secondary | ICD-10-CM

## 2018-11-17 DIAGNOSIS — E0822 Diabetes mellitus due to underlying condition with diabetic chronic kidney disease: Secondary | ICD-10-CM | POA: Diagnosis not present

## 2018-11-17 DIAGNOSIS — N182 Chronic kidney disease, stage 2 (mild): Secondary | ICD-10-CM

## 2018-11-17 NOTE — Patient Instructions (Addendum)
After Visit Summary:  We will be checking the following labs today - NONE   Medication Instructions:    Continue with your current medicines.    If you need a refill on your cardiac medications before your next appointment, please call your pharmacy.     Testing/Procedures To Be Arranged:  N/A  Follow-Up:   See Dr. Tamala Julian in a few months    At [facility], you and your health needs are our priority.  As part of our continuing mission to provide you with exceptional heart care, we have created designated Provider Care Teams.  These Care Teams include your primary Cardiologist (physician) and Advanced Practice Providers (APPs -  Physician Assistants and Nurse Practitioners) who all work together to provide you with the care you need, when you need it.  Special Instructions:  . Stay safe, stay home, wash your hands for at least 20 seconds and wear a mask when out in public.  . It was good to talk with you today.  . Call us for any recurrent chest pain . Get back to your regular exercise routine.    Call the [place] office at [phone] if you have any questions, problems or concerns.

## 2018-11-24 ENCOUNTER — Telehealth: Payer: Self-pay

## 2018-11-24 MED ORDER — ISOSORBIDE MONONITRATE ER 30 MG PO TB24: ORAL_TABLET | ORAL | 1 refills | Status: DC

## 2018-11-24 NOTE — Telephone Encounter (Signed)
Requested Prescriptions   Signed Prescriptions Disp Refills  . isosorbide mononitrate (IMDUR) 30 MG 24 hr tablet 30 tablet 1    Sig: take 1 tablet (30 mg) daily.    Authorizing Provider: Ledora Bottcher    Ordering User: Raelene Bott, Oaklyn Mans L

## 2018-12-23 DIAGNOSIS — J029 Acute pharyngitis, unspecified: Secondary | ICD-10-CM | POA: Diagnosis not present

## 2018-12-24 DIAGNOSIS — R05 Cough: Secondary | ICD-10-CM | POA: Diagnosis not present

## 2019-01-07 ENCOUNTER — Other Ambulatory Visit: Payer: Self-pay | Admitting: Nurse Practitioner

## 2019-01-07 MED ORDER — ISOSORBIDE MONONITRATE ER 30 MG PO TB24: ORAL_TABLET | ORAL | 3 refills | Status: DC

## 2019-03-09 ENCOUNTER — Ambulatory Visit: Payer: BC Managed Care – PPO | Admitting: Interventional Cardiology

## 2019-03-25 DIAGNOSIS — Z20822 Contact with and (suspected) exposure to covid-19: Secondary | ICD-10-CM | POA: Diagnosis not present

## 2019-03-28 DIAGNOSIS — Z20822 Contact with and (suspected) exposure to covid-19: Secondary | ICD-10-CM | POA: Diagnosis not present

## 2019-04-20 NOTE — Progress Notes (Signed)
CARDIOLOGY OFFICE NOTE  Date:  04/25/2019    [name] Date of Birth: [birth year] Medical Record #[id number]  PCP:  [name], MD  Cardiologist:  Kyra Manges    Chief Complaint  Patient presents with  . Follow-up    Seen for Dr. Katrinka Blazing    History of Present Illness: Deran Barro is a 59 y.o. male who presents today for a follow up visit.  Seen for Dr. Katrinka Blazing.   He has a history of CAD, HTN, DM, CKDassociated with DM, obesity, splenomegaly, hyperlipidemia, and morbid obesity status post bariatric surgery. He has three-vessel CAD disease treated with DES to left circumflex in 08/2012.   Last seen by Dr. Katrinka Blazing in February of 2020 and was doing ok.   Seen as a work in by [employer], [place] back at the end of July with chest pain for 2 months. Somewhat similar to prior chest pain syndrome. Medical therapy was implemented. I then saw him in September - he was doing much better with the addition of Imdur to his regimen. Off Viagra.    The patient does not have symptoms concerning for COVID-19 infection (fever, chills, cough, or new shortness of breath).   Comes in today. Here alone. He is doing well. 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 He was off last week - down in [facility] - did great while there with no problems whatsoever - walked 8 miles a day - felt wonderful. He is happy with how he is doing. His physical is next month and he will be having labs. BP typically much lower at home - 125/60's. He knows he needs to get back on track with his weight - admits he "vacationed" while on vacation.   Past Medical History:  Diagnosis Date  . Diabetes mellitus without complication ([facility])   . Family history of adverse reaction to anesthesia    mother- nausea and slow to wake up   . GERD (gastroesophageal reflux disease)   . History of  coronary artery stent placement   . Hypercholesterolemia   . Hyperlipidemia LDL goal < 70   . Hypertension   . Morbid obesity ([facility])   . Pleuritic chest pain    Left lower chest / RUQ pain worse with laying flat - ?Musculoskeletal versus pleurisy/pericarditis vs other   . Splenomegaly    Maybe associated with thrombocytopenia   . Thrombocytopenia ([facility])   . Unstable angina pectoris ([facility])    Onset 08/28/2012. Severe multivessel diabetic diffuse 3VCAD     Past Surgical History:  Procedure Laterality Date  . BREATH TEK H PYLORI N/A 12/12/2013   Procedure: BREATH TEK H PYLORI;  Surgeon: Glenna Fellows, MD;  Location: [facility];  Service: General;  Laterality: N/A;  . CAROTID STENT    . CHOLECYSTECTOMY N/A 01/30/2014   Procedure: LAPAROSCOPIC CHOLECYSTECTOMY WITH INTRAOPERATIVE CHOLANGIOGRAM;  Surgeon: Glenna Fellows, MD;  Location: [facility];  Service: General;  Laterality: N/A;  . GASTRIC ROUX-EN-Y N/A 01/30/2014   Procedure: LAPAROSCOPIC ROUX-EN-Y GASTRIC BYPASS WITH UPPER ENDOSCOPY;  Surgeon: Glenna Fellows, MD;  Location: [facility];  Service: General;  Laterality: N/A;  . LEFT HEART CATHETERIZATION WITH CORONARY ANGIOGRAM N/A 09/02/2012   Procedure: LEFT HEART CATHETERIZATION WITH CORONARY ANGIOGRAM;  Surgeon: Lesleigh Noe, MD;  Location: [facility] LAB;  Service: Cardiovascular;  Laterality: N/A;  . PERCUTANEOUS CORONARY STENT INTERVENTION (PCI-S) N/A 09/06/2012   Procedure: PERCUTANEOUS CORONARY STENT INTERVENTION (PCI-S);  Surgeon: Lesleigh Noe, MD;  Location: [facility] CATH LAB;  Service: Cardiovascular;  Laterality: N/A;  . VASECTOMY       Medications: Current Meds  Medication Sig  . aspirin EC 81 MG tablet Take 81 mg by mouth at bedtime.   Marland Kitchen atorvastatin (LIPITOR) 10 MG tablet TAKE 1 TABLET (10 MG TOTAL) BY MOUTH DAILY. PLEASE KEEP UPCOMING APPT IN FEBRUARY WITH DR. Katrinka Blazing FOR FUTURE REFILLS. THANK YOU  . escitalopram (LEXAPRO) 10 MG tablet Take 10 mg by mouth daily.  .  Insulin Degludec-Liraglutide (XULTOPHY) 100-3.6 UNIT-MG/ML SOPN Inject 10 Units into the skin daily.   . isosorbide mononitrate (IMDUR) 30 MG 24 hr tablet take 1 tablet (30 mg) daily.  . metFORMIN (GLUCOPHAGE) 500 MG tablet Take 500 mg by mouth 2 (two) times daily.  . metoprolol tartrate (LOPRESSOR) 25 MG tablet Take 1 tablet (25 mg total) by mouth 2 (two) times daily.  . Multiple Vitamin (MULTIVITAMIN WITH MINERALS) TABS tablet Take 1 tablet by mouth every morning.  . nitroGLYCERIN (NITROSTAT) 0.4 MG SL tablet Place 1 tablet (0.4 mg total) under the tongue every 5 (five) minutes as needed for chest pain.  Marland Kitchen temazepam (RESTORIL) 30 MG capsule Take 30 mg by mouth at bedtime as needed for sleep.      Allergies: No Known Allergies  Social History: The patient  reports that he has never smoked. He has never used smokeless tobacco. He reports current alcohol use. He reports that he does not use drugs.   Family History: The patient's family history includes Alcoholism in his father; Cancer in his mother; Cancer - Colon in his father; Diabetes Mellitus II in his mother; Diverticulitis in his mother; Heart attack in his paternal grandfather; Heart disease in his mother; Hypertension in his mother; Kidney disease in his mother.   Review of Systems: Please see the history of present illness.   All other systems are reviewed and negative.   Physical Exam: VS:  BP 140/70   Pulse 72   Ht 5\' 11"  (1.803 m)   Wt 210 lb (95.3 kg)   BMI 29.29 kg/m  .  BMI Body mass index is 29.29 kg/m.  Wt Readings from Last 3 Encounters:  04/25/19 210 lb (95.3 kg)  11/17/18 203 lb 12.8 oz (92.4 kg)  10/15/18 201 lb (91.2 kg)    General: Pleasant. Well developed, well nourished and in no acute distress.   HEENT: Normal.  Neck: Supple, no JVD, carotid bruits, or masses noted.  Cardiac: Regular rate and rhythm. No murmurs, rubs, or gallops. No edema.  Respiratory:  Lungs are clear to auscultation bilaterally  with normal work of breathing.  GI: Soft and nontender.  MS: No deformity or atrophy. Gait and ROM intact.  Skin: Warm and dry. Color is normal.  Neuro:  Strength and sensation are intact and no gross focal deficits noted.  Psych: Alert, appropriate and with normal affect.   LABORATORY DATA:  EKG:  EKG is not ordered today.  Lab Results  Component Value Date   WBC 11.0 (H) 02/01/2014   HGB 13.3 02/01/2014   HCT 39.6 02/01/2014   PLT 146 (L) 02/01/2014   GLUCOSE 136 (H) 01/31/2014   CHOL 115 10/28/2013   TRIG 100 10/28/2013   HDL 34 (L) 10/28/2013   LDLCALC 61 10/28/2013   ALT 89 (H) 01/31/2014   AST 54 (  H) 01/31/2014   NA 139 01/31/2014   K 4.3 01/31/2014   CL 100 01/31/2014   CREATININE 0.78 01/31/2014   BUN 13 01/31/2014   CO2 23 01/31/2014   TSH 0.795 10/28/2013   INR 0.96 09/05/2012   HGBA1C 7.4 (H) 10/28/2013       BNP (last 3 results) No results for input(s): BNP in the last 8760 hours.  ProBNP (last 3 results) No results for input(s): PROBNP in the last 8760 hours.   Other Studies Reviewed Today:  ABI 05/04/18: Summary: Right: Resting right ankle-brachial index indicates noncompressible right lower extremity arteries.The right toe-brachial index is normal.  Left: Resting left ankle-brachial index indicates noncompressible left lower extremity arteries.The left toe-brachial index is normal.  No significant blockages.   Cardiac Cath 09/02/12 The left anterior descending artery is heavily calcified proximally. Luminal irregularities with up to 30% narrowing is noted. The first diagonal is small to moderate in size and 99% (not graftable) the second diagonal arising from the mid LAD is moderate to large in size or diagonal and contains 80-90% ostial narrowing beyond the second diagonal the LAD becomes diffusely diseased with up to 70-80% stenosis and a segmental region that overlaps the third diagonal. The diagonal is small. The LAD beyond the third  diagonal is diffusely diseased although no high-grade focal lesion until the apical LAD where 90% stenosis is noted there is heard minimal transapical LAD distribution..  The left circumflex artery is the culprit vessel. The entire mid vessel contains severe disease with tandem stenoses of 70%, 99%, and 70% before the origin of the second and third obtuse marginal branches. This entire segment could be treated with a long drug-eluting stent and were probably significantly improve anginal symptoms.  The right coronary artery is dominant. Moderate irregularities are noted in the proximal and mid segment. A large PDA is 95% obstructed. The bifurcation of the distal right coronary into the continuation of the RCA beyond the PDA contains 70% stenosis (forming a Medina 0, 1, 1 bifurcation). The PDA/RCA continuation could be treated with PCI although bifurcation PCI is associated with a higher risk of ischemic complications and restenosis.  Due to flank pain, patient unable to lie flat and intervention was rescheduled.  Heart cath 09/06/12: Successfulstenting of the MID circumflex from 99% to 0% with TIMI grade 3 flow to   Assessment/Plan:  1. Chest pain - known CAD - on medical therapy after prior discussion with Dr. Katrinka Blazing - he is doing well - there does seem to be a stress component with his employment noted. I have left him on his current regimen for now.   2. HTN - has better BP control at home - will conitnue with his beta blocker and nitrate therapy.   3. HLD - on statin - labs next month with PCP  4. Obesity - he is planning on getting back on track. Has had bariatric surgery in the past and has lost about 100#  5. Remote PCI of the mid LCX - with known residual disease - see #1.   6. DM - per PCP  7. COVID-19 Education: The signs and symptoms of COVID-19 were discussed with the patient and how to seek care for testing (follow up with PCP or arrange E-visit).  The importance of  social distancing, staying at home, hand hygiene and wearing a mask when out in public were discussed today.  Current medicines are reviewed with the patient today.  The patient does not have concerns regarding  medicines other than what has been noted above.  The following changes have been made:  See above.  Labs/ tests ordered today include:   No orders of the defined types were placed in this encounter.    Disposition:   FU with Dr. Tamala Julian in 6 months. I am happy to see back as needed.    Patient is agreeable to this plan and will call if any problems develop in the interim.   SignedTruitt Merle, NP  04/25/2019 2:03 PM  [facility] [address] [place], [place]  [address] Phone: [phone] Fax: [phone]

## 2019-04-25 ENCOUNTER — Other Ambulatory Visit: Payer: Self-pay

## 2019-04-25 ENCOUNTER — Ambulatory Visit: Payer: BC Managed Care – PPO | Admitting: Nurse Practitioner

## 2019-04-25 ENCOUNTER — Encounter: Payer: Self-pay | Admitting: Nurse Practitioner

## 2019-04-25 VITALS — BP 140/70 | HR 72 | Ht 71.0 in | Wt 210.0 lb

## 2019-04-25 DIAGNOSIS — Z7189 Other specified counseling: Secondary | ICD-10-CM

## 2019-04-25 DIAGNOSIS — Z794 Long term (current) use of insulin: Secondary | ICD-10-CM

## 2019-04-25 DIAGNOSIS — I25110 Atherosclerotic heart disease of native coronary artery with unstable angina pectoris: Secondary | ICD-10-CM

## 2019-04-25 DIAGNOSIS — E0822 Diabetes mellitus due to underlying condition with diabetic chronic kidney disease: Secondary | ICD-10-CM

## 2019-04-25 DIAGNOSIS — I10 Essential (primary) hypertension: Secondary | ICD-10-CM | POA: Diagnosis not present

## 2019-04-25 DIAGNOSIS — N182 Chronic kidney disease, stage 2 (mild): Secondary | ICD-10-CM

## 2019-04-25 DIAGNOSIS — E785 Hyperlipidemia, unspecified: Secondary | ICD-10-CM

## 2019-04-25 NOTE — Patient Instructions (Addendum)
After Visit Summary:  We will be checking the following labs today - NONE   Medication Instructions:    Continue with your current medicines.    If you need a refill on your cardiac medications before your next appointment, please call your pharmacy.     Testing/Procedures To Be Arranged:  N/A  Follow-Up:   See Dr. Katrinka Blazing in 6 months.     At [facility], you and your health needs are our priority.  As part of our continuing mission to provide you with exceptional heart care, we have created designated Provider Care Teams.  These Care Teams include your primary Cardiologist (physician) and Advanced Practice Providers (APPs -  Physician Assistants and Nurse Practitioners) who all work together to provide you with the care you need, when you need it.  Special Instructions:  . Stay safe, stay home, wash your hands for at least 20 seconds and wear a mask when out in public.  . It was good to talk with you today.    Call the [facility] office at [phone] if you have any questions, problems or concerns.

## 2019-04-29 DIAGNOSIS — E119 Type 2 diabetes mellitus without complications: Secondary | ICD-10-CM | POA: Diagnosis not present

## 2019-05-02 ENCOUNTER — Other Ambulatory Visit: Payer: Self-pay | Admitting: Interventional Cardiology

## 2019-06-15 ENCOUNTER — Other Ambulatory Visit: Payer: Self-pay | Admitting: Interventional Cardiology

## 2019-06-15 DIAGNOSIS — I2510 Atherosclerotic heart disease of native coronary artery without angina pectoris: Secondary | ICD-10-CM

## 2019-07-12 ENCOUNTER — Emergency Department (HOSPITAL_COMMUNITY)
Admission: EM | Admit: 2019-07-12 | Discharge: 2019-07-13 | Disposition: A | Discharge status: Other Acute Inpatient Hospital | Payer: 59 | Attending: Emergency Medicine | Admitting: Emergency Medicine

## 2019-07-12 ENCOUNTER — Other Ambulatory Visit: Payer: Self-pay

## 2019-07-12 DIAGNOSIS — Z7984 Long term (current) use of oral hypoglycemic drugs: Secondary | ICD-10-CM | POA: Insufficient documentation

## 2019-07-12 DIAGNOSIS — F1020 Alcohol dependence, uncomplicated: Secondary | ICD-10-CM

## 2019-07-12 DIAGNOSIS — E119 Type 2 diabetes mellitus without complications: Secondary | ICD-10-CM | POA: Diagnosis not present

## 2019-07-12 DIAGNOSIS — F10959 Alcohol use, unspecified with alcohol-induced psychotic disorder, unspecified: Secondary | ICD-10-CM | POA: Diagnosis not present

## 2019-07-12 DIAGNOSIS — Z7982 Long term (current) use of aspirin: Secondary | ICD-10-CM | POA: Insufficient documentation

## 2019-07-12 DIAGNOSIS — I10 Essential (primary) hypertension: Secondary | ICD-10-CM | POA: Diagnosis not present

## 2019-07-12 DIAGNOSIS — R45851 Suicidal ideations: Secondary | ICD-10-CM

## 2019-07-12 DIAGNOSIS — F333 Major depressive disorder, recurrent, severe with psychotic symptoms: Secondary | ICD-10-CM | POA: Diagnosis not present

## 2019-07-12 DIAGNOSIS — Z20822 Contact with and (suspected) exposure to covid-19: Secondary | ICD-10-CM | POA: Insufficient documentation

## 2019-07-12 DIAGNOSIS — Z79899 Other long term (current) drug therapy: Secondary | ICD-10-CM | POA: Insufficient documentation

## 2019-07-12 LAB — ETHANOL: Alcohol, Ethyl (B): <10 mg/dL (ref <10)

## 2019-07-12 LAB — CBC
HCT: 44.3 % (ref 39.0–52.0)
Hemoglobin: 15.1 g/dL (ref 13.0–17.0)
MCH: 29.8 pg (ref 26.0–34.0)
MCHC: 34.1 g/dL (ref 30.0–36.0)
MCV: 87.5 fL (ref 80.0–100.0)
Platelets: 194 10*3/uL (ref 150–400)
RBC: 5.06 MIL/uL (ref 4.22–5.81)
RDW: 12.6 % (ref 11.5–15.5)
WBC: 8.1 10*3/uL (ref 4.0–10.5)
nRBC: 0 % (ref 0.0–0.2)

## 2019-07-12 LAB — RAPID URINE DRUG SCREEN, HOSP PERFORMED
Amphetamines: NOT DETECTED
Barbiturates: NOT DETECTED
Benzodiazepines: POSITIVE — AB
Cocaine: NOT DETECTED
Opiates: NOT DETECTED
Tetrahydrocannabinol: NOT DETECTED

## 2019-07-12 LAB — COMPREHENSIVE METABOLIC PANEL
ALT: 35 U/L (ref 0–44)
AST: 29 U/L (ref 15–41)
Albumin: 4.4 g/dL (ref 3.5–5.0)
Alkaline Phosphatase: 73 U/L (ref 38–126)
Anion gap: 9 (ref 5–15)
BUN: 16 mg/dL (ref 6–20)
CO2: 22 mmol/L (ref 22–32)
Calcium: 9.1 mg/dL (ref 8.9–10.3)
Chloride: 105 mmol/L (ref 98–111)
Creatinine, Ser: 0.67 mg/dL (ref 0.61–1.24)
GFR calc Af Amer: >60 mL/min (ref >60)
GFR calc non Af Amer: >60 mL/min (ref >60)
Glucose, Bld: 140 mg/dL — ABNORMAL HIGH (ref 70–99)
Potassium: 4.1 mmol/L (ref 3.5–5.1)
Sodium: 136 mmol/L (ref 135–145)
Total Bilirubin: 0.9 mg/dL (ref 0.3–1.2)
Total Protein: 7.3 g/dL (ref 6.5–8.1)

## 2019-07-12 LAB — ACETAMINOPHEN LEVEL: Acetaminophen (Tylenol), Serum: <10 ug/mL — ABNORMAL LOW (ref 10–30)

## 2019-07-12 LAB — SALICYLATE LEVEL: Salicylate Lvl: <7 mg/dL — ABNORMAL LOW (ref 7.0–30.0)

## 2019-07-12 MED ORDER — ZOLPIDEM TARTRATE 5 MG PO TABS: 5.0000 mg | ORAL_TABLET | Freq: Every evening | ORAL | Status: DC | PRN

## 2019-07-12 MED ORDER — LORAZEPAM 2 MG/ML IJ SOLN: 0.0000 mg | Freq: Four times a day (QID) | INTRAMUSCULAR | Status: DC

## 2019-07-12 MED ORDER — THIAMINE HCL 100 MG PO TABS
100.0000 mg | ORAL_TABLET | Freq: Every day | ORAL | Status: DC
  Administered 2019-07-12 – 2019-07-13 (×2): 100 mg via ORAL
  Filled 2019-07-12 (×2): qty 1

## 2019-07-12 MED ORDER — ONDANSETRON HCL 4 MG PO TABS: 4.0000 mg | ORAL_TABLET | Freq: Three times a day (TID) | ORAL | Status: DC | PRN

## 2019-07-12 MED ORDER — THIAMINE HCL 100 MG/ML IJ SOLN: 100.0000 mg | Freq: Every day | INTRAMUSCULAR | Status: DC

## 2019-07-12 MED ORDER — LORAZEPAM 2 MG/ML IJ SOLN: 0.0000 mg | Freq: Two times a day (BID) | INTRAMUSCULAR | Status: DC

## 2019-07-12 MED ORDER — LORAZEPAM 1 MG PO TABS: 0.0000 mg | ORAL_TABLET | Freq: Two times a day (BID) | ORAL | Status: DC

## 2019-07-12 MED ORDER — LORAZEPAM 1 MG PO TABS
0.0000 mg | ORAL_TABLET | Freq: Four times a day (QID) | ORAL | Status: DC
  Administered 2019-07-12: 19:00:00 1 mg via ORAL
  Filled 2019-07-12: qty 2
  Filled 2019-07-12: qty 1

## 2019-07-12 NOTE — BH Assessment (Signed)
Tele Assessment Note   Patient Name: Maurice Johnston MRN: [id number] Referring Physician: Derwood Kaplan, MD Location of Patient: [place] Location of Provider: Behavioral Health TTS Department  Maurice Johnston is an 59 y.o. male with a history of depression and Alcohol Abuse who presents voluntarily with his wife for psychiatric evaluation due to recent onset of SI and an overdose attempt on Sunday.  Patient reports significant work related stress within the past 6 months or more, as his company [employer] was bought out by [employer].  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  As the president, he carries a great deal of stress associated with feeling responsible for this decision that has impacted his life and the lives of so many employees.  He is having difficulty coping and he's turned to drinking more often to self-medicate.  He has a history of alcohol use, which had worsened to increased/binge drinking on 3-4 times per week.  He has been sober for 3 years in the past and he recognizes he is genetically predisposed to addiction tendencies, as his father was an alcoholic.  Patient admits to overdosing on tamazapam (15 tabs) and his blood pressure medication(15 tabs) on Sunday.  He told no one and woke up Monday morning, feeling okay and he presented to work.  The stressors continued and today, he began having suicidal thoughts again.  He opted to tell his wife about the attempt and continued SI today.  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  Patient denies HI and AVH.  He is unable to affirm his safety and requests inpatient treatment.  Patient gave consent for LPC to contact his wife.  Patient's wife, Maurice Johnston, states she is very concerned for her husband.   He is aware that he needs to make some major life/career changes to improve stress levels and overall mental health.  She is supportive of patient seeking inpatient treatment.  She is willing to engage in treatment and safety planning.  There are no weapons in the home.  She also supports long term outpatient treatment and will encourage patient to engage in therapy.    Patient is calm, cooperative and behaviorally appropriate.  Patient is appropriately dressed.  Speech is soft.  Eye contact is fair.  Patient's mood is significantly depressed/despondent.  Affect is congruent with mood.  Thought process is coherent and relevant.  There is no indication patient is responding to internal stimuli or experiencing delusional thought content.  Judgement and insight are limited.    Diagnosis: Major Depressive Disorder, Recurrent Severe                    Alcohol Use Disroder, moderate   Past Medical History:  Past Medical History:  Diagnosis Date  . Diabetes mellitus without complication (HCC)   . Family history of adverse reaction to anesthesia    mother- nausea and slow to wake up   . GERD (gastroesophageal reflux disease)   . History of coronary artery stent placement   . Hypercholesterolemia   . Hyperlipidemia LDL goal < 70   . Hypertension   . Morbid obesity (HCC)   . Pleuritic chest pain    Left lower chest / RUQ pain worse with laying flat - ?Musculoskeletal versus pleurisy/pericarditis  vs other   . Splenomegaly    Maybe associated with thrombocytopenia   . Thrombocytopenia ([place])   . Unstable angina pectoris ([place])    Onset 08/28/2012. Severe multivessel diabetic diffuse 3VCAD     Past Surgical History:  Procedure Laterality Date  . BREATH TEK H PYLORI N/A 12/12/2013   Procedure: BREATH TEK H PYLORI;  Surgeon: Excell Seltzer, MD;  Location: [facility];  Service: General;  Laterality: N/A;  . CAROTID STENT    . CHOLECYSTECTOMY N/A 01/30/2014   Procedure: LAPAROSCOPIC  CHOLECYSTECTOMY WITH INTRAOPERATIVE CHOLANGIOGRAM;  Surgeon: Excell Seltzer, MD;  [facility]: [facility];  Service: General;  Laterality: N/A;  . GASTRIC ROUX-EN-Y N/A 01/30/2014   Procedure: LAPAROSCOPIC ROUX-EN-Y GASTRIC BYPASS WITH UPPER ENDOSCOPY;  Surgeon: Excell Seltzer, MD;  [facility]: [facility];  Service: General;  Laterality: N/A;  . LEFT HEART CATHETERIZATION WITH CORONARY ANGIOGRAM N/A 09/02/2012   Procedure: LEFT HEART CATHETERIZATION WITH CORONARY ANGIOGRAM;  Surgeon: Sinclair Grooms, MD;  Location: [facility] CATH LAB;  Service: Cardiovascular;  Laterality: N/A;  . PERCUTANEOUS CORONARY STENT INTERVENTION (PCI-S) N/A 09/06/2012   Procedure: PERCUTANEOUS CORONARY STENT INTERVENTION (PCI-S);  Surgeon: Sinclair Grooms, MD;  Location: [facility] CATH LAB;  Service: Cardiovascular;  Laterality: N/A;  . VASECTOMY      Family History:  Family History  Problem Relation Age of Onset  . Diabetes Mellitus II Mother   . Cancer Mother        breast & skin  . Hypertension Mother   . Heart disease Mother   . Kidney disease Mother   . Diverticulitis Mother   . Alcoholism Father   . Cancer - Colon Father   . Heart attack Paternal Grandfather     Social History:  reports that he has never smoked. He has never used smokeless tobacco. He reports current alcohol use. He reports that he does not use drugs.  Additional Social History:  Alcohol / Drug Use Pain Medications: See MAR Prescriptions: See MAR Over the Counter: See MAR History of alcohol / drug use?: Yes Longest period of sobriety (when/how long): several years - within the past 10 yrs Negative Consequences of Use: Work / Youth worker, Personal relationships Substance #1 Name of Substance 1: ETOH 1 - Age of First Use: 15 1 - Amount (size/oz): 6 -15 pack of beer or 6+ ciders 1 - Frequency: 3-4 X per wk 1 - Duration: past 2 yrs 1 - Last Use / Amount: yesterday afternoon - 6 pack of ciders  CIWA: CIWA-Ar BP: (!) 166/86 Pulse Rate: 65 Nausea and  Vomiting: no nausea and no vomiting Tactile Disturbances: none Tremor: no tremor Auditory Disturbances: not present Paroxysmal Sweats: no sweat visible Visual Disturbances: not present Anxiety: mildly anxious Headache, Fullness in Head: very mild Agitation: normal activity Orientation and Clouding of Sensorium: oriented and can do serial additions CIWA-Ar Total: 2 COWS:    Allergies: No Known Allergies  Home Medications: (Not in a hospital admission)   OB/GYN Status:  No LMP for male patient.  General Assessment Data [facility] of Assessment: [facility] TTS Assessment: In system Is this a Tele or Face-to-Face Assessment?: Tele Assessment Is this an Initial Assessment or a Re-assessment for this encounter?: Initial Assessment Patient Accompanied by:: Other(Wife) Language Other than English: No Living Arrangements: Other (Comment)(Private home) What gender do you identify as?: Male Marital status: Married [place] name: N/A Pregnancy Status: No Living Arrangements: Spouse/significant other, Other relatives(spouse and 57 y.o.mother) Can pt return to current living arrangement?: Yes  Admission Status: Voluntary Is patient capable of signing voluntary admission?: Yes Referral Source: Self/Family/Friend Insurance type: [facility] [address]     Crisis Care Plan Living Arrangements: Spouse/significant other, Other relatives(spouse and 53 y.o.mother) Legal Guardian: Other:(self) Name of Psychiatrist: None Name of Therapist: None  Education Status Is patient currently in school?: No Is the patient employed, unemployed or receiving disability?: Employed  Risk to self with the past 6 months Suicidal Ideation: Yes-Currently Present Has patient been a risk to self within the past 6 months prior to admission? : Yes Suicidal Intent: Yes-Currently Present Has patient had any suicidal intent within the past 6 months prior to admission? : Yes Is patient at risk for suicide?: Yes Suicidal Plan?:  Yes-Currently Present Has patient had any suicidal plan within the past 6 months prior to admission? : Yes Specify Current Suicidal Plan: overdose - reports overdose on Sunday Access to Means: Yes Specify Access to Suicidal Means: Rx and OTC medicines What has been your use of drugs/alcohol within the last 12 months?: increased alcohol use Previous Attempts/Gestures: Yes How many times?: 1 Other Self Harm Risks: significant work related stress - regrets Triggers for Past Attempts: (work related stress) Intentional Self Injurious Behavior: None Family Suicide History: No Recent stressful life event(s): (work related stress) Persecutory voices/beliefs?: No Depression: Yes Depression Symptoms: Despondent, Loss of interest in usual pleasures, Tearfulness, Feeling worthless/self pity Substance abuse history and/or treatment for substance abuse?: Yes Suicide prevention information given to non-admitted patients: Not applicable  Risk to Others within the past 6 months Homicidal Ideation: No Does patient have any lifetime risk of violence toward others beyond the six months prior to admission? : No Thoughts of Harm to Others: No Current Homicidal Intent: No Current Homicidal Plan: No Access to Homicidal Means: No Identified Victim: N/A History of harm to others?: No Assessment of Violence: None Noted Violent Behavior Description: N/A Does patient have access to weapons?: No Criminal Charges Pending?: No Does patient have a court date: No Is patient on probation?: No  Psychosis Hallucinations: None noted Delusions: None noted  Mental Status Report Appearance/Hygiene: Unremarkable Eye Contact: Fair Motor Activity: Unremarkable Speech: Soft Level of Consciousness: Alert Mood: Depressed, Empty, Despair Affect: Depressed, Sad Anxiety Level: Minimal Thought Processes: Coherent, Relevant Judgement: Impaired Orientation: Person, Place, Time, Situation Obsessive Compulsive  Thoughts/Behaviors: None  Cognitive Functioning Concentration: Decreased Memory: Recent Intact, Remote Intact Is patient IDD: No Insight: Poor Impulse Control: Poor Appetite: Fair Have you had any weight changes? : No Change Sleep: Decreased Total Hours of Sleep: 5(varies) Vegetative Symptoms: None  ADLScreening [facility] Assessment Services) Patient's cognitive ability adequate to safely complete daily activities?: Yes Patient able to express need for assistance with ADLs?: No Independently performs ADLs?: Yes (appropriate for developmental age)  Prior Inpatient Therapy Prior Inpatient Therapy: No  Prior Outpatient Therapy Prior Outpatient Therapy: No Does patient have an ACCT team?: No Does patient have Intensive In-House Services?  : No Does patient have [facility] services? : No Does patient have P4CC services?: No  ADL Screening (condition at time of admission) Patient's cognitive ability adequate to safely complete daily activities?: Yes Is the patient deaf or have difficulty hearing?: No Does the patient have difficulty seeing, even when wearing glasses/contacts?: No Does the patient have difficulty concentrating, remembering, or making decisions?: Yes Patient able to express need for assistance with ADLs?: No Does the patient have difficulty dressing or bathing?: No Independently performs ADLs?: Yes (appropriate for developmental age) Does the patient have difficulty walking or climbing stairs?:  No Weakness of Legs: None Weakness of Arms/Hands: None  Home Assistive Devices/Equipment Home Assistive Devices/Equipment: None  Therapy Consults (therapy consults require a physician order) PT Evaluation Needed: No OT Evalulation Needed: No SLP Evaluation Needed: No Abuse/Neglect Assessment (Assessment to be complete while patient is alone) Abuse/Neglect Assessment Can Be Completed: Yes Physical Abuse: Denies Verbal Abuse: Denies Exploitation of patient/patient's  resources: Denies Self-Neglect: Denies Values / Beliefs Cultural Requests During Hospitalization: None Spiritual Requests During Hospitalization: None Consults Spiritual Care Consult Needed: No Transition of Care Team Consult Needed: No Advance Directives (For Healthcare) Does Patient Have a Medical Advance Directive?: No Would patient like information on creating a medical advance directive?: No - Patient declined     Disposition: Inpatient treatment is recommended per Assunta Found, NP.  TTS to seek pursue appropriate inpatient treatment programs.   Disposition Initial Assessment Completed for this Encounter: Yes Patient referred to: Other (Comment)([facility] )  This service was provided via telemedicine using a 2-way, interactive audio and video technology.  Names of all persons participating in this telemedicine service and their role in this encounter. Name: Sydell Axon, [facility] Role: TTS Therapist  Name: Assunta Found, NP Role: TTS Provider  Name: [name] Role: pt   Yetta Glassman 07/12/2019 7:01 PM

## 2019-07-12 NOTE — ED Provider Notes (Addendum)
[place] DEPT Provider Note   CSN: [id number] Arrival date & time: 07/12/19  1552     History Chief Complaint  Patient presents with  . Suicidal    Maurice Johnston is a 59 y.o. male.  HPI     59 year old male with history of diabetes, hypertension, hyperlipidemia and alcoholism comes in a chief complaint of SI.  Patient reports that he has been undergoing a lot of stress because of likely impending job loss.  He has been alcoholic all his life.  On Sunday he overdosed on 15 sleep medications and 50 blood pressure medications with intent to kill himself.  However, he woke up and went to work on Monday.  He has similar ideations again, and has decided to come to the ER for further help.  Patient has not been diagnosed with any specific psychiatric condition. Pt denies nausea, emesis, fevers, chills, chest pains, shortness of breath, headaches, abdominal pain, uti like symptoms.   Past Medical History:  Diagnosis Date  . Diabetes mellitus without complication ([place])   . Family history of adverse reaction to anesthesia    mother- nausea and slow to wake up   . GERD (gastroesophageal reflux disease)   . History of coronary artery stent placement   . Hypercholesterolemia   . Hyperlipidemia LDL goal < 70   . Hypertension   . Morbid obesity ([place])   . Pleuritic chest pain    Left lower chest / RUQ pain worse with laying flat - ?Musculoskeletal versus pleurisy/pericarditis vs other   . Splenomegaly    Maybe associated with thrombocytopenia   . Thrombocytopenia ([place])   . Unstable angina pectoris ([place])    Onset 08/28/2012. Severe multivessel diabetic diffuse 3VCAD     Patient Active Problem List   Diagnosis Date Noted  . Splenomegaly 09/07/2012    Class: Chronic  . Thrombocytopenia ([place]) 09/07/2012    Class: Chronic  . Pleuritic chest pain 09/02/2012    Class: Acute  . Coronary atherosclerosis of native coronary artery 09/01/2012    Class:  Acute  . 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 Essential hypertension 09/01/2012    Class: Chronic  . Morbid obesity ([place]) 09/01/2012  . Hyperlipidemia with target LDL less than 70 09/01/2012    Class: Chronic    Past Surgical History:  Procedure Laterality Date  . BREATH TEK H PYLORI N/A 12/12/2013   Procedure: BREATH TEK H PYLORI;  Surgeon: Excell Seltzer, MD;  Location: [facility];  Service: General;  Laterality: N/A;  . CAROTID STENT    . CHOLECYSTECTOMY N/A 01/30/2014   Procedure: LAPAROSCOPIC CHOLECYSTECTOMY WITH INTRAOPERATIVE CHOLANGIOGRAM;  Surgeon: Excell Seltzer, MD;  [facility];  Service: General;  Laterality: N/A;  . GASTRIC ROUX-EN-Y N/A 01/30/2014   Procedure: LAPAROSCOPIC ROUX-EN-Y GASTRIC BYPASS WITH UPPER ENDOSCOPY;  Surgeon: Excell Seltzer, MD;  Location: [facility];  Service: General;  Laterality: N/A;  . LEFT HEART CATHETERIZATION WITH CORONARY ANGIOGRAM N/A 09/02/2012   Procedure: LEFT HEART CATHETERIZATION WITH CORONARY ANGIOGRAM;  Surgeon: Sinclair Grooms, MD;  Location: [facility] CATH LAB;  Service: Cardiovascular;  Laterality: N/A;  . PERCUTANEOUS CORONARY STENT INTERVENTION (PCI-S) N/A 09/06/2012   Procedure: PERCUTANEOUS CORONARY STENT INTERVENTION (PCI-S);  Surgeon: Sinclair Grooms, MD;  Location: [facility] CATH LAB;  Service: Cardiovascular;  Laterality: N/A;  . VASECTOMY         Family History  Problem Relation Age  of Onset  . Diabetes Mellitus II Mother   . Cancer Mother        breast & skin  . Hypertension Mother   . Heart disease Mother   . Kidney disease Mother   . Diverticulitis Mother   . Alcoholism Father   . Cancer - Colon Father   . Heart attack Paternal Grandfather     Social History   Tobacco Use  . Smoking status: Never Smoker  . Smokeless tobacco: Never Used  Substance Use Topics  . Alcohol use: Yes    Comment: occasional  . Drug use: No     Home Medications Prior to Admission medications   Medication Sig Start Date End Date Taking? Authorizing Provider  aspirin EC 81 MG tablet Take 81 mg by mouth at bedtime.     [provider]  atorvastatin (LIPITOR) 10 MG tablet TAKE 1 TABLET BY MOUTH DAILY. 06/15/19   Rosalio Macadamia, NP  escitalopram (LEXAPRO) 10 MG tablet Take 10 mg by mouth daily. 10/14/14   [provider]  Insulin Degludec-Liraglutide (XULTOPHY) 100-3.6 UNIT-MG/ML SOPN Inject 10 Units into the skin daily.     [provider]  isosorbide mononitrate (IMDUR) 30 MG 24 hr tablet take 1 tablet (30 mg) daily. 01/07/19   Rosalio Macadamia, NP  metFORMIN (GLUCOPHAGE) 500 MG tablet Take 500 mg by mouth 2 (two) times daily. 03/23/17   [provider]  metoprolol tartrate (LOPRESSOR) 25 MG tablet TAKE 1 TABLET BY MOUTH TWICE A DAY 05/02/19   Lyn Records, MD  Multiple Vitamin (MULTIVITAMIN WITH MINERALS) TABS tablet Take 1 tablet by mouth every morning.    [provider]  nitroGLYCERIN (NITROSTAT) 0.4 MG SL tablet Place 1 tablet (0.4 mg total) under the tongue every 5 (five) minutes as needed for chest pain. 10/15/18   [facility], Roe Rutherford, PA  temazepam (RESTORIL) 30 MG capsule Take 30 mg by mouth at bedtime as needed for sleep.  10/06/13   [provider]    Allergies    Patient has no known allergies.  Review of Systems   Review of Systems  Constitutional: Positive for activity change.  Respiratory: Negative for shortness of breath.   Cardiovascular: Negative for chest pain.  Gastrointestinal: Negative for nausea and vomiting.  Psychiatric/Behavioral: Positive for suicidal ideas.  All other systems reviewed and are negative.   Physical Exam Updated Vital Signs BP (!) 152/79   Pulse 65   Temp 98.2 F (36.8 C) (Oral)   Resp 18   Ht 5\' 11"  (1.803 m)   Wt 93.4 kg   SpO2 98%   BMI 28.73 kg/m   Physical Exam Vitals and nursing note reviewed.  Constitutional:       Appearance: He is well-developed.  HENT:     Head: Atraumatic.  Cardiovascular:     Rate and Rhythm: Normal rate.  Pulmonary:     Effort: Pulmonary effort is normal.  Musculoskeletal:     Cervical back: Neck supple.  Skin:    General: Skin is warm.  Neurological:     Mental Status: He is alert and oriented to person, place, and time.  Psychiatric:     Comments: Emotionally labile     ED Results / Procedures / Treatments   Labs (all labs ordered are listed, but only abnormal results are displayed) Labs Reviewed  COMPREHENSIVE METABOLIC PANEL  ETHANOL  SALICYLATE LEVEL  ACETAMINOPHEN LEVEL  CBC  RAPID URINE DRUG SCREEN, HOSP PERFORMED  EKG None  Radiology No results found.  Procedures Procedures (including critical care time)  Medications Ordered in ED Medications - No data to display  ED Course  I have reviewed the triage vital signs and the nursing notes.  Pertinent labs & imaging results that were available during my care of the patient were reviewed by me and considered in my medical decision making (see chart for details).    MDM Rules/Calculators/A&P                      59 year old male comes in a chief complaint of SI.  He has history of alcoholism, and he wants to see if he can quit as well.  He has multiple medical conditions, however no psych history. He is medically cleared for psych eval.  Given his history of heavy alcohol consumption, we anticipate some electrolyte abnormalities and possible thrombocytopenia.   The patient has been placed in psychiatric observation due to the need to provide a safe environment for the patient while obtaining psychiatric consultation and evaluation, as well as ongoing medical and medication management to treat the patient's condition.  The patient has not been placed under full IVC at this time.  Final Clinical Impression(s) / ED Diagnoses Final diagnoses:  Suicidal ideation  Alcoholism [facility])    Rx /  DC Orders ED Discharge Orders    None       Derwood Kaplan, MD 07/12/19 1655    Derwood Kaplan, MD 07/13/19 [phone]

## 2019-07-12 NOTE — ED Triage Notes (Signed)
Pt POV voluntarily with c/o SI with plan.  Pt reports trying to commit suicide on Sunday by taking "whatever was left in my prescription bottles."   Pt denies HI, denies AVH. Pt calm and cooperative at this time.  Pt denies ETOH, also reports that he has only taken prescription medications as prescribed today.

## 2019-07-12 NOTE — ED Notes (Signed)
Pts belongings (clothes, ipad, cell phone) given to pts wife, [name].  Pt aware.

## 2019-07-12 NOTE — Progress Notes (Signed)
Patient meets inpatient criteria per Shuvon Rankin, NP. Patient has been faxed out to the following facilities for review:   [facility]  [facility]  [facility]  [facility]  [facility]  [facility]  [facility]  CSW will continue to follow and assist with securing bed placement.   Suhani Stillion, MSW, LCSW-A Clinical Disposition Social Worker [facility]/TTS [phone]   

## 2019-07-12 NOTE — ED Notes (Signed)
On admission to the Secure [facility] pt is sad, a little tearful. 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  He said that he took his insulin and medications this morning. This Clinical research associate did ask Dr. Rhunette Croft to look at his home medications.

## 2019-07-13 LAB — RESPIRATORY PANEL BY RT PCR (FLU A&B, COVID)
Influenza A by PCR: NEGATIVE
Influenza B by PCR: NEGATIVE
SARS Coronavirus 2 by RT PCR: NEGATIVE

## 2019-07-13 MED ORDER — INSULIN DEGLUDEC-LIRAGLUTIDE 100-3.6 UNIT-MG/ML ~~LOC~~ SOPN: 20.0000 [IU] | PEN_INJECTOR | Freq: Every day | SUBCUTANEOUS | Status: DC

## 2019-07-13 MED ORDER — ASPIRIN EC 81 MG PO TBEC: 81.0000 mg | DELAYED_RELEASE_TABLET | Freq: Every day | ORAL | Status: DC

## 2019-07-13 MED ORDER — METOPROLOL TARTRATE 25 MG PO TABS
25.0000 mg | ORAL_TABLET | Freq: Two times a day (BID) | ORAL | Status: DC
  Administered 2019-07-13: 25 mg via ORAL
  Filled 2019-07-13: qty 1

## 2019-07-13 MED ORDER — ESCITALOPRAM OXALATE 10 MG PO TABS: 20.0000 mg | ORAL_TABLET | Freq: Every day | ORAL | Status: DC

## 2019-07-13 MED ORDER — METFORMIN HCL 500 MG PO TABS
500.0000 mg | ORAL_TABLET | Freq: Two times a day (BID) | ORAL | Status: DC
  Administered 2019-07-13: 09:00:00 500 mg via ORAL
  Filled 2019-07-13: qty 1

## 2019-07-13 MED ORDER — ADULT MULTIVITAMIN W/MINERALS CH
1.0000 | ORAL_TABLET | Freq: Every morning | ORAL | Status: DC
  Administered 2019-07-13: 09:00:00 1 via ORAL
  Filled 2019-07-13: qty 1

## 2019-07-13 MED ORDER — ISOSORBIDE MONONITRATE ER 30 MG PO TB24: 30.0000 mg | ORAL_TABLET | Freq: Every day | ORAL | Status: DC

## 2019-07-13 MED ORDER — ATORVASTATIN CALCIUM 10 MG PO TABS
10.0000 mg | ORAL_TABLET | Freq: Every day | ORAL | Status: DC
  Filled 2019-07-13: qty 1

## 2019-07-13 MED ORDER — TEMAZEPAM 15 MG PO CAPS: 30.0000 mg | ORAL_CAPSULE | Freq: Every evening | ORAL | Status: DC | PRN

## 2019-07-13 NOTE — Progress Notes (Signed)
07/13/2019  0834  Called safe transport [phone] to transport patient to [facility].

## 2019-07-13 NOTE — ED Provider Notes (Signed)
Emergency Medicine Observation Re-evaluation Note  Maurice Johnston is a 59 y.o. male, seen on rounds today.  Pt initially presented to the ED for complaints of Suicidal Currently, the patient is for patient to receive inpatient admission for his mental health.  Physical Exam  BP (!) 144/70   Pulse 74   Temp 98.6 F (37 C) (Oral)   Resp 16   Ht 5\' 11"  (1.803 m)   Wt 93.4 kg   SpO2 98%   BMI 28.73 kg/m  Physical Exam Vitals and nursing note reviewed.  Constitutional:      Appearance: He is well-developed.  HENT:     Head: Atraumatic.  Cardiovascular:     Rate and Rhythm: Normal rate.  Pulmonary:     Effort: Pulmonary effort is normal.  Skin:    General: Skin is warm.  Neurological:     Mental Status: He is alert and oriented to person, place, and time.  Psychiatric:        Mood and Affect: Mood normal.      ED Course / MDM  EKG:    I have reviewed the labs performed to date as well as medications administered while in observation.  Recent changes in the last 24 hours include : none.  Home medications ordered. Plan  Current plan is for . Patient is not under full IVC at this time.   , MD 07/13/19 [phone]

## 2019-07-13 NOTE — BH Assessment (Signed)
[facility] Assessment Progress Note   Maurice Johnston with [facility] said that Dr. Roselyn Reef has accepted patient to their facility.  Pt can come after 09:00 on 04/28 to [facility] building.  Nurse call report to [phone].

## 2019-07-13 NOTE — Progress Notes (Signed)
07/13/2019  0825  Called report to [facility] [phone]. Report given to [facility].

## 2019-07-14 ENCOUNTER — Encounter (HOSPITAL_COMMUNITY): Payer: Self-pay | Admitting: Emergency Medicine

## 2019-10-21 ENCOUNTER — Other Ambulatory Visit: Payer: Self-pay

## 2019-10-21 MED ORDER — ISOSORBIDE MONONITRATE ER 30 MG PO TB24: ORAL_TABLET | ORAL | 1 refills | Status: AC

## 2019-10-21 NOTE — Telephone Encounter (Signed)
Pt's medication was sent to pt's requested pharmacy. Confirmation received.  

## 2021-09-03 ENCOUNTER — Ambulatory Visit
Admit: 2021-09-03 | Discharge: 2021-09-03 | Payer: PRIVATE HEALTH INSURANCE | Attending: Family Medicine | Primary: Family Medicine

## 2021-09-03 DIAGNOSIS — E119 Type 2 diabetes mellitus without complications: Secondary | ICD-10-CM

## 2021-09-03 LAB — POCT GLYCOSYLATED HEMOGLOBIN (HGB A1C): Hemoglobin A1C: 7.1 %

## 2021-09-03 MED ORDER — GABAPENTIN 300 MG PO CAPS
300 MG | ORAL_CAPSULE | Freq: Two times a day (BID) | ORAL | 3 refills | Status: DC
Start: 2021-09-03 — End: 2021-10-04

## 2021-09-03 MED ORDER — METFORMIN HCL 500 MG PO TABS
500 MG | ORAL_TABLET | Freq: Two times a day (BID) | ORAL | 3 refills | Status: AC
Start: 2021-09-03 — End: 2021-12-02

## 2021-09-03 MED ORDER — DICLOFENAC SODIUM 75 MG PO TBEC
75 MG | ORAL_TABLET | Freq: Two times a day (BID) | ORAL | 3 refills | Status: AC
Start: 2021-09-03 — End: 2021-12-02

## 2021-09-03 MED ORDER — ATORVASTATIN CALCIUM 10 MG PO TABS
10 MG | ORAL_TABLET | Freq: Every day | ORAL | 3 refills | Status: AC
Start: 2021-09-03 — End: ?

## 2021-09-03 MED ORDER — SILDENAFIL CITRATE 100 MG PO TABS
100 | ORAL_TABLET | ORAL | 1 refills | Status: AC | PRN
Start: 2021-09-03 — End: ?

## 2021-09-03 MED ORDER — METOPROLOL TARTRATE 25 MG PO TABS
25 MG | ORAL_TABLET | Freq: Two times a day (BID) | ORAL | 3 refills | Status: AC
Start: 2021-09-03 — End: ?

## 2021-09-03 NOTE — Patient Instructions (Signed)
[facility] Locations - No appointment necessary.  ? indicates the location is open Saturdays in addition to Monday through Friday.   Call your preferred location for test preparation, business hours and other information you need.   [facility] accepts all insurances.  CENTRAL  EAST  WEST    ? [place]   [address]   [address]   [place] [address]    Ph: [phone]  [facility]   [address] [place] [address]    Ph: [phone]   ? [place]   [address]    [place] [address]    Ph: [phone]     [facility]   [address] [place] [address]    Ph: [phone] ? [place]   [address] [place] [address]   Ph: [phone]  ? [facility]   [address] [place] [address]    Ph: [phone]      [place]   [address] [place] [address]   Ph: [phone]    NORTH    ? [place]   [address] [place] [address]    Ph: [phone]  [facility]   [address] [place] [address]   Ph: [phone]  [place]   [address]   [place], [address]    PH: [phone]    [facility]   [address] [place] [address]    Ph: [phone]    [facility]   [address] [place] [address]    Ph: [phone]

## 2021-09-03 NOTE — Progress Notes (Unsigned)
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 Had a Cardiology - needs a new Cardiology. A1C believes was 7.4 then. Last COLON about 1-2 years ago. Clear - family hx told to do in 5 years.     PHQ Scores 09/03/2021   PHQ2 Score 0   PHQ9 Score 0     Interpretation of Total Score Depression Severity: 1-4 = Minimal depression, 5-9 = Mild depression, 10-14 = Moderate depression, 15-19 = Moderately severe depression, 20-27 = Severe depression     Prior to Visit Medications    Medication Sig Taking? Authorizing Provider   gabapentin (NEURONTIN) 300 MG capsule Take 1 capsule by mouth 2 times daily. Yes Historical Provider, MD   atorvastatin (LIPITOR) 10 MG tablet Take 1 tablet by mouth daily Yes Historical Provider, MD   diclofenac (VOLTAREN) 75 MG EC tablet Take 1 tablet by mouth 2 times daily Yes Historical Provider, MD   sildenafil (VIAGRA) 100 MG tablet Take 1 tablet by mouth as needed Yes Historical Provider, MD   metoprolol tartrate (LOPRESSOR) 25 MG tablet Take 1 tablet by mouth 2 times daily Yes Historical Provider, MD   metFORMIN (GLUCOPHAGE) 500 MG tablet Take 2 tablets by mouth 2 times daily (with meals) Yes Historical Provider, MD       Past Medical History:   Diagnosis Date    Arthritis of spine 09/03/2021    Erectile dysfunction 2005    Hypertension 2001    Other idiopathic scoliosis, lumbar region 09/03/2021    S/P coronary artery stent placement 09/14/2012    Type 2 diabetes mellitus without complication (HCC) 2001        Social History     Tobacco Use    Smoking status: Never    Smokeless tobacco: Never   Substance Use Topics    Alcohol use: Yes     Alcohol/week: 24.0  standard drinks     Types: 24 Cans of beer per week    Drug use: Not Currently        Past Surgical History:   Procedure Laterality Date    CHOLECYSTECTOMY      GASTRIC BYPASS SURGERY      PTCA  2014    VASECTOMY          No Known Allergies     Family History   Problem Relation Age of Onset    Arthritis Mother     Breast Cancer Mother     Heart Disease Mother     High Blood Pressure Mother     Kidney Disease Mother     Miscarriages / [place] Mother     Stroke Mother     Macular Degen Mother     Alcohol Abuse Father     Colon Cancer Father     Depression Father     Early Death Father     Mental Illness Father         Patient's past medical history, surgical history, family history, medications, and allergies  were all reviewed and updated as appropriate today.  Review of Systems    BP (!) 137/53 (Cuff Size: Medium Adult)   Pulse 56   Temp 97.9 F (36.6 C) (Oral)   Ht 5' 9.25" (1.759 m)   Wt 172 lb 9.6 oz (78.3 kg)   SpO2 99% Comment: room air  BMI 25.30 kg/m      Physical Exam    Assessment:  Encounter Diagnoses   Name Primary?    Other male erectile dysfunction     Other idiopathic scoliosis, lumbar region     Arthritis of spine     Type 2 diabetes mellitus without complication, without long-term current use of insulin (HCC) Yes    S/P gastric bypass     S/P coronary artery stent placement     Primary hypertension        Plan:  1. Other male erectile dysfunction  ***  - sildenafil (VIAGRA) 100 MG tablet; Take 1 tablet by mouth as needed for Erectile Dysfunction  Dispense: 30 tablet; Refill: 1    2. Other idiopathic scoliosis, lumbar region  ***  - gabapentin (NEURONTIN) 300 MG capsule; Take 1 capsule by mouth 2 times daily for 90 days.  Dispense: 180 capsule; Refill: 3  - diclofenac (VOLTAREN) 75 MG EC tablet; Take 1 tablet by mouth 2 times daily  Dispense: 180 tablet; Refill: 3    3. Arthritis of spine  ***  - gabapentin (NEURONTIN) 300 MG capsule; Take 1 capsule by mouth 2 times daily for 90 days.   Dispense: 180 capsule; Refill: 3  - diclofenac (VOLTAREN) 75 MG EC tablet; Take 1 tablet by mouth 2 times daily  Dispense: 180 tablet; Refill: 3    4. Type 2 diabetes mellitus without complication, without long-term current use of insulin (HCC)  ***  - [facility]  - POCT glycosylated hemoglobin (Hb A1C)  - metFORMIN (GLUCOPHAGE) 500 MG tablet; Take 2 tablets by mouth 2 times daily (with meals)  Dispense: 360 tablet; Refill: 3    5. S/P gastric bypass  ***    6. S/P coronary artery stent placement  ***  - [facility]  - metoprolol tartrate (LOPRESSOR) 25 MG tablet; Take 1 tablet by mouth 2 times daily  Dispense: 180 tablet; Refill: 3  - atorvastatin (LIPITOR) 10 MG tablet; Take 1 tablet by mouth daily  Dispense: 90 tablet; Refill: 3    7. Primary hypertension  ***  -  [facility]  - metoprolol tartrate (LOPRESSOR) 25 MG tablet; Take 1 tablet by mouth 2 times daily  Dispense: 180 tablet; Refill: 3      Return in about 4 months (around 01/03/2022) for F/U chronic issues.               Natividad Brood, DO     Please note that this chart was generated using dragon dictation software.  Although every effort was made to ensure the accuracy of this automated transcription, some errors in transcription may have occurred.

## 2021-10-04 ENCOUNTER — Ambulatory Visit
Admit: 2021-10-04 | Discharge: 2021-10-04 | Payer: PRIVATE HEALTH INSURANCE | Attending: Family Medicine | Primary: Family Medicine

## 2021-10-04 DIAGNOSIS — M47819 Spondylosis without myelopathy or radiculopathy, site unspecified: Secondary | ICD-10-CM

## 2021-10-04 MED ORDER — GABAPENTIN 300 MG PO CAPS
300 MG | ORAL_CAPSULE | ORAL | 1 refills | Status: AC
Start: 2021-10-04 — End: 2021-11-08

## 2021-10-04 MED ORDER — LANCETS MISC
11 refills | Status: AC
Start: 2021-10-04 — End: ?

## 2021-10-04 MED ORDER — BLOOD GLUCOSE TEST VI STRP
ORAL_STRIP | 11 refills | Status: AC
Start: 2021-10-04 — End: ?

## 2021-10-04 NOTE — Progress Notes (Unsigned)
[facility] PHYSICIAN PRACTICES  [facility]  [address]  [place] [address]  Dept: [phone]  Dept Fax: [phone]     10/04/2021      James Shepard   Sep 16, 1960     Chief Complaint   Patient presents with    Leg Pain     Leg pain and numbness, right leg is worse         HPI  Pt comes in today for discussion again today for issues related to legs. He is noticing numbness in the RLE, then at night with sleep pain in foot/leg. There is some daytime pain as well. Was sent by [facility] in [place] - no significant blockages? Low back pain about the same as always. Last images in last 1-2 years.    PHQ Scores 09/03/2021   PHQ2 Score 0   PHQ9 Score 0     Interpretation of Total Score Depression Severity: 1-4 = Minimal depression, 5-9 = Mild depression, 10-14 = Moderate depression, 15-19 = Moderately severe depression, 20-27 = Severe depression     Prior to Visit Medications    Medication Sig Taking? Authorizing Provider   sildenafil (VIAGRA) 100 MG tablet Take 1 tablet by mouth as needed for Erectile Dysfunction Yes Glean Hess Addy Mcmannis, DO   metoprolol tartrate (LOPRESSOR) 25 MG tablet Take 1 tablet by mouth 2 times daily Yes Glean Hess Gweneth Fredlund, DO   gabapentin (NEURONTIN) 300 MG capsule Take 1 capsule by mouth 2 times daily for 90 days. Yes Glean Hess Bayler Gehrig, DO   diclofenac (VOLTAREN) 75 MG EC tablet Take 1 tablet by mouth 2 times daily Yes Glean Hess Myeesha Shane, DO   atorvastatin (LIPITOR) 10 MG tablet Take 1 tablet by mouth daily Yes Glean Hess Arah Aro, DO   metFORMIN (GLUCOPHAGE) 500 MG tablet Take 2 tablets by mouth 2 times daily (with meals) Yes Wetzel Bjornstad, DO       Past Medical History:   Diagnosis Date    Arthritis of spine 09/03/2021    Erectile dysfunction 2005    Hypertension 2001    Other idiopathic scoliosis, lumbar region 09/03/2021    S/P coronary artery stent placement 09/14/2012    Type 2 diabetes mellitus without complication (HCC) 2001         Social History     Tobacco Use    Smoking status: Never    Smokeless tobacco: Never   Substance Use Topics    Alcohol use: Yes     Alcohol/week: 24.0 standard drinks     Types: 24 Cans of beer per week    Drug use: Not Currently        Past Surgical History:   Procedure Laterality Date    CHOLECYSTECTOMY      GASTRIC BYPASS SURGERY      PTCA  2014    VASECTOMY          No Known Allergies     Family History   Problem Relation Age of Onset    Arthritis Mother     Breast Cancer Mother     Heart Disease Mother     High Blood Pressure Mother     Kidney Disease Mother     Miscarriages / [place] Mother     Stroke Mother     Macular Degen Mother     Alcohol Abuse Father     Colon Cancer Father     Depression Father     Early Death Father  Mental Illness Father         Patient's past medical history, surgical history, family history, medications, and allergies  were all reviewed and updated as appropriate today.    Review of Systems    BP 131/70 (Cuff Size: Medium Adult)   Pulse 59   Temp 97.8 F (36.6 C) (Oral)   Wt 170 lb 6.4 oz (77.3 kg)   SpO2 98% Comment: room air  BMI 24.98 kg/m      Physical Exam    Assessment:  Encounter Diagnoses   Name Primary?    Arthritis of spine Yes    Other idiopathic scoliosis, lumbar region     Neuropathic pain of both legs     Type 2 diabetes mellitus without complication, without long-term current use of insulin (HCC)        Plan:  1. Arthritis of spine  ***  - MRI LUMBAR SPINE WO CONTRAST; Future  - gabapentin (NEURONTIN) 300 MG capsule; Take 1 capsule in morning, 1 capsule at lunch and 2 capsules at night.  Dispense: 120 capsule; Refill: 1    2. Other idiopathic scoliosis, lumbar region  ***  - MRI LUMBAR SPINE WO CONTRAST; Future  - gabapentin (NEURONTIN) 300 MG capsule; Take 1 capsule in morning, 1 capsule at lunch and 2 capsules at night.  Dispense: 120 capsule; Refill: 1    3. Neuropathic pain of both legs  ***  - MRI LUMBAR SPINE WO CONTRAST; Future  - gabapentin  (NEURONTIN) 300 MG capsule; Take 1 capsule in morning, 1 capsule at lunch and 2 capsules at night.  Dispense: 120 capsule; Refill: 1    4. Type 2 diabetes mellitus without complication, without long-term current use of insulin (HCC)  ***  - Lancets MISC; Test 3 times a day & as needed for symptoms of irregular blood glucose.  Dispense: 100 each; Refill: 11  - blood glucose monitor strips; Test 3 times a day & as needed for symptoms of irregular blood glucose.  Dispense: 100 strip; Refill: 11      Return if symptoms worsen or fail to improve.               Natividad Brood, DO     Please note that this chart was generated using dragon dictation software.  Although every effort was made to ensure the accuracy of this automated transcription, some errors in transcription may have occurred.

## 2021-10-04 NOTE — Patient Instructions (Signed)
[facility] Locations - No appointment necessary.  ? indicates the location is open Saturdays in addition to Monday through Friday.   Call your preferred location for test preparation, business hours and other information you need.   [facility] accepts all insurances.  CENTRAL  EAST  WEST    ? [place]   [address]   [address]   [place] [address]    Ph: [phone]  [facility]   [address] [place] [address]    Ph: [phone]   ? [place]   [address]    [place] [address]    Ph: [phone]     [facility]   [address] [place] [address]    Ph: [phone] ? [place]   [address] [place] [address]   Ph: [phone]  ? [facility]   [address] [place] [address]    Ph: [phone]      [place]   [address] [place] [address]   Ph: [phone]    NORTH    ? [place]   [address] [place] [address]    Ph: [phone]  [facility]   [address] [place] [address]   Ph: [phone]  [place]   [address]   [place], [address]    PH: [phone]    [facility]   [address] [place] [address]    Ph: [phone]    [facility]   [address] [place] [address]    Ph: [phone]

## 2021-12-01 ENCOUNTER — Encounter

## 2021-12-02 MED ORDER — GABAPENTIN 300 MG PO CAPS
300 MG | ORAL_CAPSULE | ORAL | 1 refills | Status: AC
Start: 2021-12-02 — End: 2022-01-06

## 2021-12-02 MED ORDER — GABAPENTIN 300 MG PO CAPS
300 MG | ORAL_CAPSULE | ORAL | 1 refills | Status: DC
Start: 2021-12-02 — End: 2021-12-02

## 2021-12-02 NOTE — Telephone Encounter (Signed)
From: Karma Ganja  To: Dr. Christeen Douglas  Sent: 12/01/2021 4:45 PM EDT  Subject: Refill called in to new pharmacy    [place], [facility] you [facility] [place]?   [place], [place] [address]  I will not be able to see my new doctor before it runs out. I tried to just transfer it but they said a new perscription hafd to be called in. Thanks Mikki Santee

## 2021-12-02 NOTE — Telephone Encounter (Signed)
Rx pended for review

## 2021-12-02 NOTE — Addendum Note (Signed)
Addended by: Barbaraann Rondo on: 12/02/2021 10:05 AM     Modules accepted: Orders

## 2021-12-02 NOTE — Telephone Encounter (Signed)
RX failed due to no DEA populated ."Code: 76 - Transaction rejected   Controlled substance must have a DEASchedule populated in Medication Prescribed "    May be due to no PCP listed.  May need to print Rx and we fax script to pharmacy

## 2021-12-31 ENCOUNTER — Encounter: Payer: PRIVATE HEALTH INSURANCE | Attending: Family Medicine

## 2022-08-12 NOTE — Telephone Encounter (Signed)
Left message on patient's voice mail.  Is he still our patient?  If so needs appt for over due check up.

## 2022-08-14 NOTE — Telephone Encounter (Signed)
LVM for patient to call office back and schedule follow up or let us know if they are no longer under our care.

## 2022-08-23 ENCOUNTER — Encounter

## 2022-08-25 NOTE — Telephone Encounter (Signed)
Patient called they are no longer with [facility] and is seeing cynthia shaper with [facility]

## 2022-11-17 ENCOUNTER — Encounter

## 2022-11-18 NOTE — Telephone Encounter (Signed)
 Declined viagra-  patient no longer our patient

## 2023-02-26 ENCOUNTER — Encounter

## 2023-02-26 NOTE — Telephone Encounter (Signed)
Metoprolol declined.  June this year it was noted that he does not have Dr Dwan Bolt for a PCP
